# Patient Record
Sex: Female | Born: 1955
Health system: Southern US, Community
[De-identification: ages and names within clinical notes are randomized; demographics above are authoritative.]

## PROBLEM LIST (undated history)

## (undated) DIAGNOSIS — R112 Nausea with vomiting, unspecified: Secondary | ICD-10-CM

## (undated) DIAGNOSIS — R519 Headache, unspecified: Secondary | ICD-10-CM

## (undated) DIAGNOSIS — F419 Anxiety disorder, unspecified: Secondary | ICD-10-CM

## (undated) DIAGNOSIS — M1990 Unspecified osteoarthritis, unspecified site: Secondary | ICD-10-CM

## (undated) DIAGNOSIS — N189 Chronic kidney disease, unspecified: Secondary | ICD-10-CM

## (undated) DIAGNOSIS — C801 Malignant (primary) neoplasm, unspecified: Secondary | ICD-10-CM

## (undated) DIAGNOSIS — J189 Pneumonia, unspecified organism: Secondary | ICD-10-CM

## (undated) DIAGNOSIS — E0590 Thyrotoxicosis, unspecified without thyrotoxic crisis or storm: Secondary | ICD-10-CM

## (undated) DIAGNOSIS — Z87442 Personal history of urinary calculi: Secondary | ICD-10-CM

## (undated) DIAGNOSIS — G8929 Other chronic pain: Secondary | ICD-10-CM

## (undated) DIAGNOSIS — Z9889 Other specified postprocedural states: Secondary | ICD-10-CM

## (undated) DIAGNOSIS — I10 Essential (primary) hypertension: Secondary | ICD-10-CM

## (undated) HISTORY — PX: MOHS SURGERY: SHX181

## (undated) HISTORY — PX: PARTIAL KNEE ARTHROPLASTY: SHX2174

## (undated) HISTORY — PX: WISDOM TOOTH EXTRACTION: SHX21

---

## 1997-07-31 ENCOUNTER — Other Ambulatory Visit: Admission: RE | Admit: 1997-07-31 | Discharge: 1997-07-31 | Payer: Self-pay | Admitting: Obstetrics and Gynecology

## 1998-08-23 ENCOUNTER — Other Ambulatory Visit: Admission: RE | Admit: 1998-08-23 | Discharge: 1998-08-23 | Payer: Self-pay | Admitting: Obstetrics and Gynecology

## 1999-07-30 ENCOUNTER — Other Ambulatory Visit: Admission: RE | Admit: 1999-07-30 | Discharge: 1999-07-30 | Payer: Self-pay | Admitting: Obstetrics and Gynecology

## 1999-07-30 ENCOUNTER — Encounter (INDEPENDENT_AMBULATORY_CARE_PROVIDER_SITE_OTHER): Payer: Self-pay | Admitting: Specialist

## 1999-10-24 ENCOUNTER — Other Ambulatory Visit: Admission: RE | Admit: 1999-10-24 | Discharge: 1999-10-24 | Payer: Self-pay | Admitting: Obstetrics and Gynecology

## 2000-09-04 ENCOUNTER — Encounter: Payer: Self-pay | Admitting: Sports Medicine

## 2000-09-04 ENCOUNTER — Encounter: Admission: RE | Admit: 2000-09-04 | Discharge: 2000-09-04 | Payer: Self-pay | Admitting: Sports Medicine

## 2000-09-25 ENCOUNTER — Encounter: Payer: Self-pay | Admitting: Sports Medicine

## 2000-09-25 ENCOUNTER — Encounter: Admission: RE | Admit: 2000-09-25 | Discharge: 2000-09-25 | Payer: Self-pay | Admitting: Sports Medicine

## 2000-10-09 ENCOUNTER — Encounter: Admission: RE | Admit: 2000-10-09 | Discharge: 2000-10-09 | Payer: Self-pay | Admitting: Sports Medicine

## 2000-10-09 ENCOUNTER — Encounter: Payer: Self-pay | Admitting: Sports Medicine

## 2000-11-04 ENCOUNTER — Other Ambulatory Visit: Admission: RE | Admit: 2000-11-04 | Discharge: 2000-11-04 | Payer: Self-pay | Admitting: Obstetrics and Gynecology

## 2001-11-04 ENCOUNTER — Other Ambulatory Visit: Admission: RE | Admit: 2001-11-04 | Discharge: 2001-11-04 | Payer: Self-pay | Admitting: Obstetrics and Gynecology

## 2002-11-07 ENCOUNTER — Other Ambulatory Visit: Admission: RE | Admit: 2002-11-07 | Discharge: 2002-11-07 | Payer: Self-pay | Admitting: Obstetrics and Gynecology

## 2003-04-19 ENCOUNTER — Encounter: Admission: RE | Admit: 2003-04-19 | Discharge: 2003-04-19 | Payer: Self-pay | Admitting: Otolaryngology

## 2003-11-09 ENCOUNTER — Other Ambulatory Visit: Admission: RE | Admit: 2003-11-09 | Discharge: 2003-11-09 | Payer: Self-pay | Admitting: Obstetrics and Gynecology

## 2004-12-09 ENCOUNTER — Other Ambulatory Visit: Admission: RE | Admit: 2004-12-09 | Discharge: 2004-12-09 | Payer: Self-pay | Admitting: Obstetrics and Gynecology

## 2005-06-20 ENCOUNTER — Encounter (INDEPENDENT_AMBULATORY_CARE_PROVIDER_SITE_OTHER): Payer: Self-pay | Admitting: *Deleted

## 2005-06-20 ENCOUNTER — Ambulatory Visit (HOSPITAL_COMMUNITY): Admission: RE | Admit: 2005-06-20 | Discharge: 2005-06-20 | Payer: Self-pay | Admitting: Obstetrics and Gynecology

## 2005-11-26 IMAGING — CT CT PARANASAL SINUSES LIMITED
1 series · 16 of 24 positions shown, 20 images · non-contrast
Comparison: none

CLINICAL DATA: Headaches, facial pressure and pain. 
 CT LIMITED SINUS W/O CONTRAST
 Limited scans through the paranasal sinuses were performed in the prone coronal position.  There is no present evidence of sinusitis.  No mucosal thickening is seen.  The nasal turbinates are normal in size and the nasal airway is patent.  Minimal deviation of the nasal septum to the left is noted.  
 IMPRESSION
 No evidence of sinusitis.

[Series 2: — · axial · 0.33mm/px · z∈[+21,+106]mm · 16 of 24 slices shown, 20 images]
[im 2/24  brain]
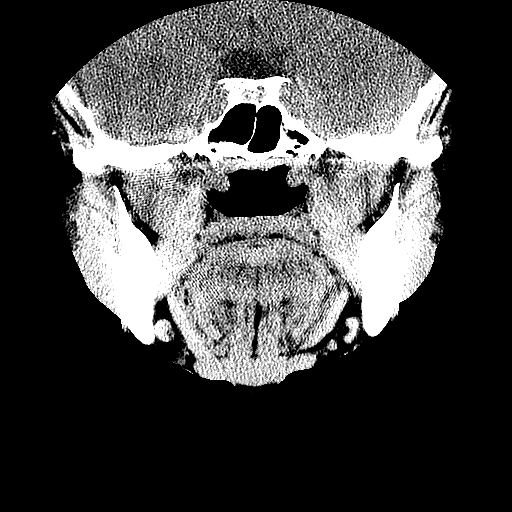
[im 2/24  bone]
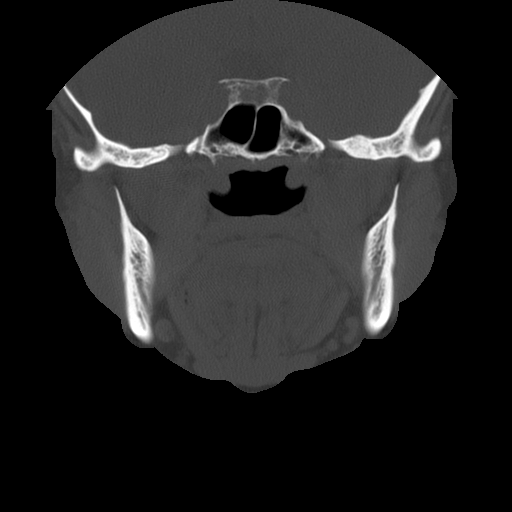
[im 4/24  bone]
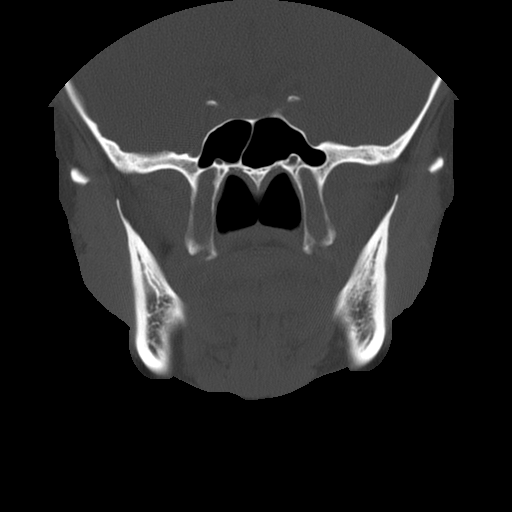
[im 5/24  bone]
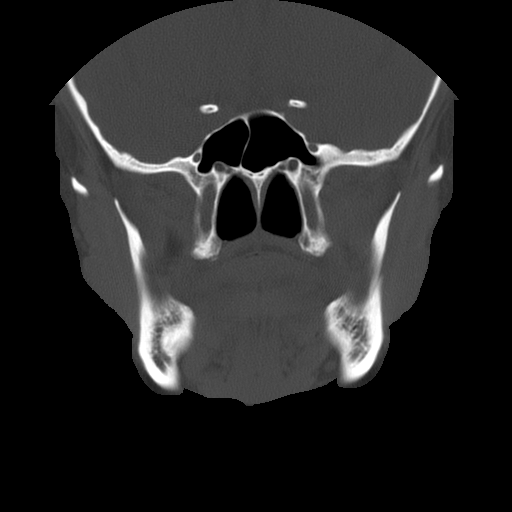
[im 6/24  bone]
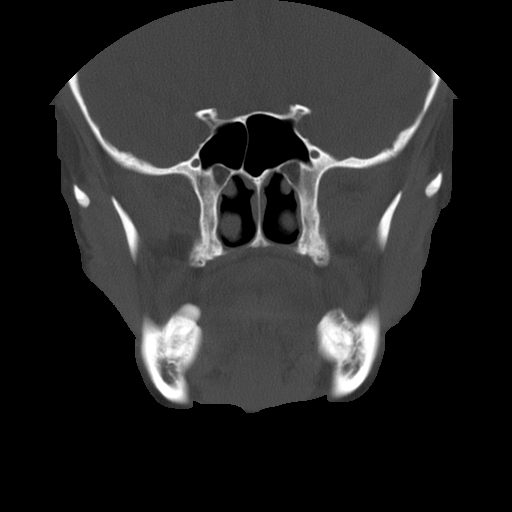
[im 8/24  brain]
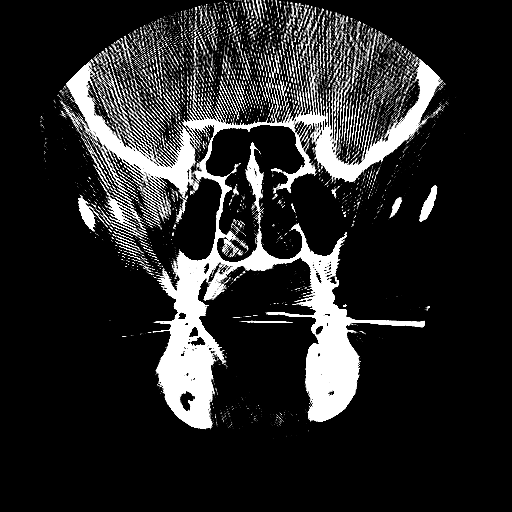
[im 8/24  bone]
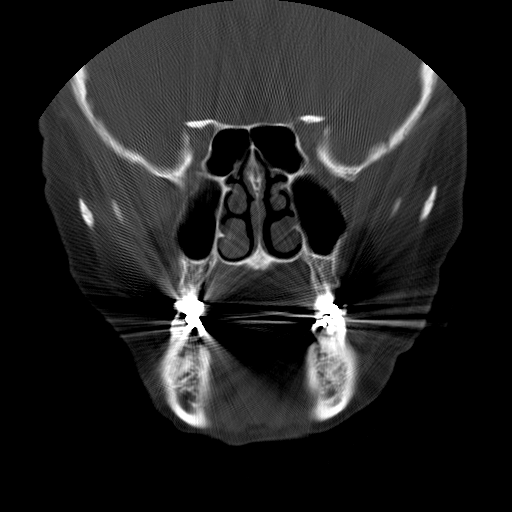
[im 9/24  bone]
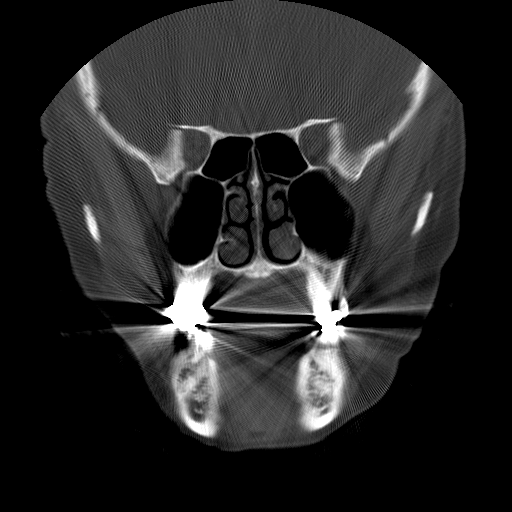
[im 10/24  bone]
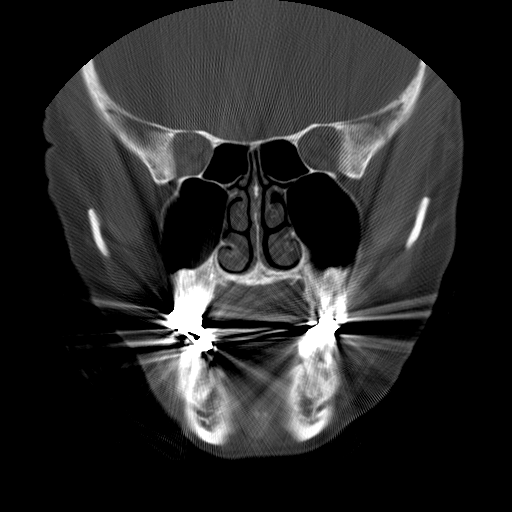
[im 12/24  bone]
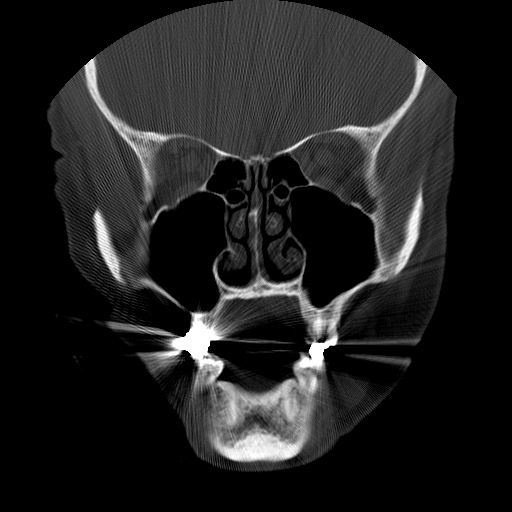
[im 13/24  brain]
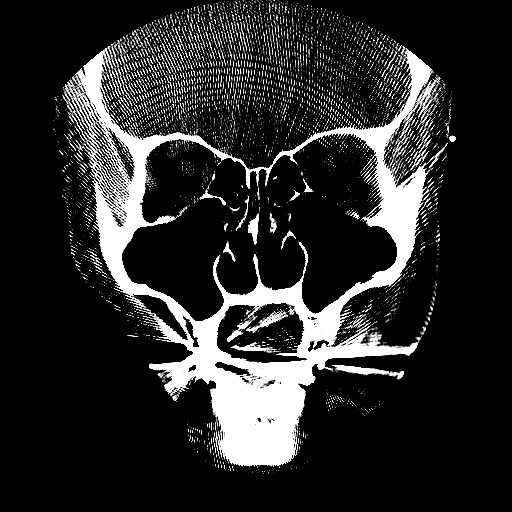
[im 13/24  bone]
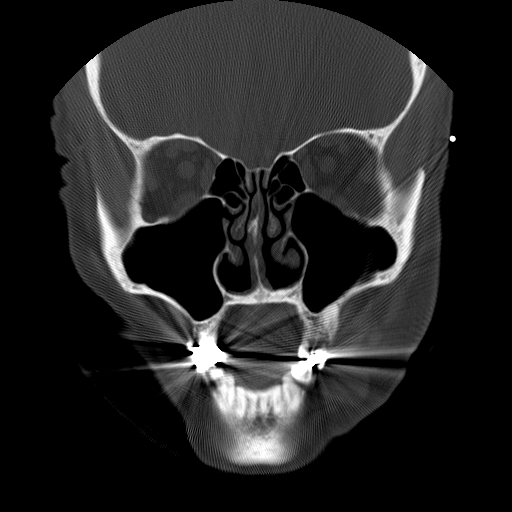
[im 15/24  bone]
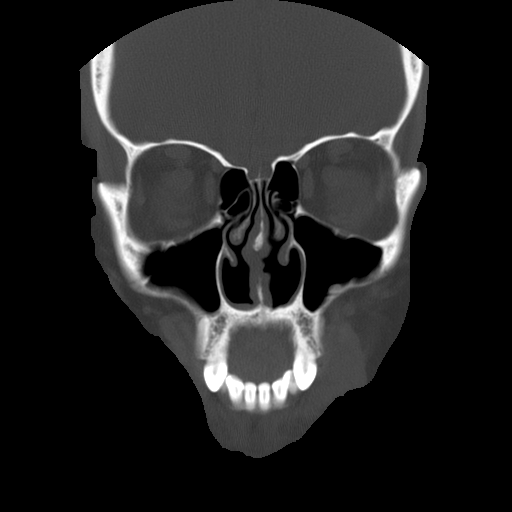
[im 16/24  bone]
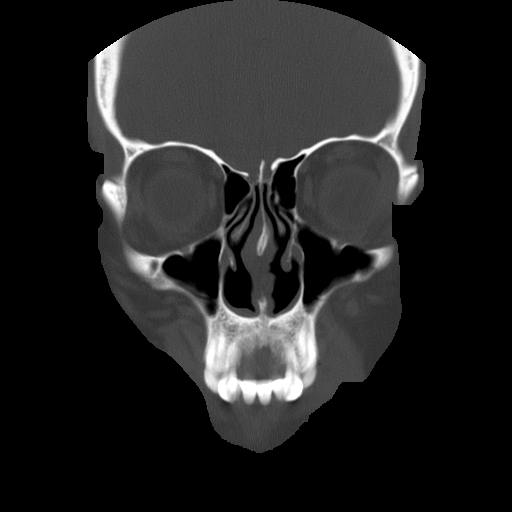
[im 17/24  bone]
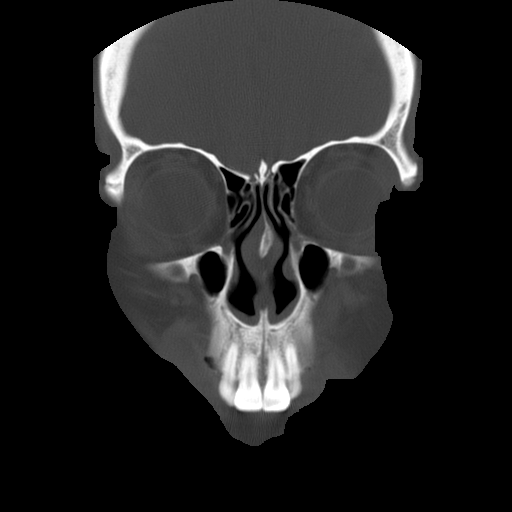
[im 19/24  brain]
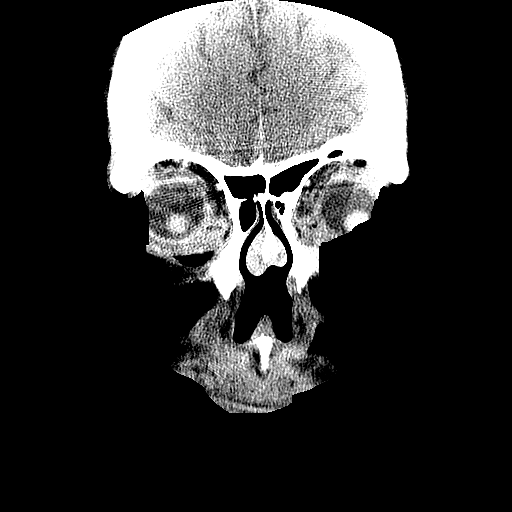
[im 19/24  bone]
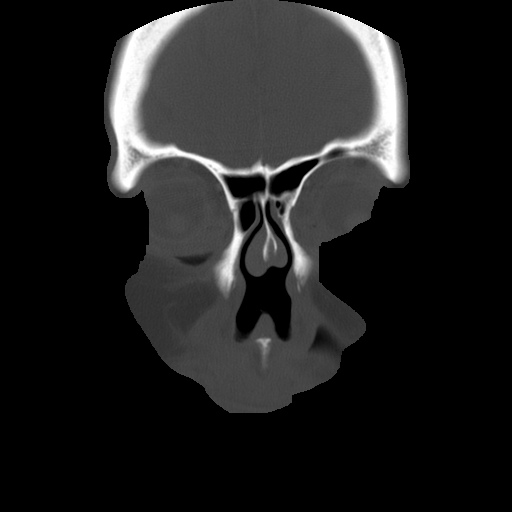
[im 20/24  bone]
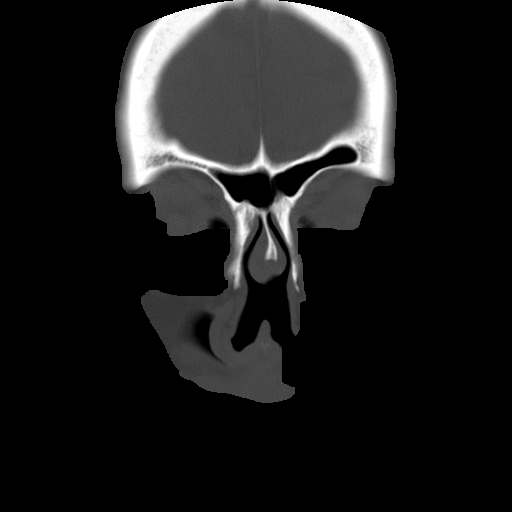
[im 21/24  bone]
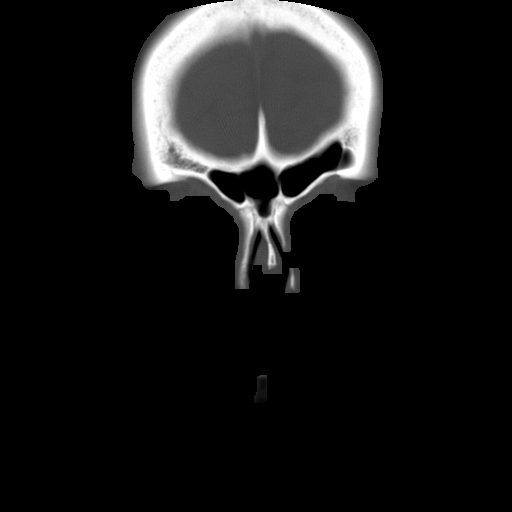
[im 23/24  bone]
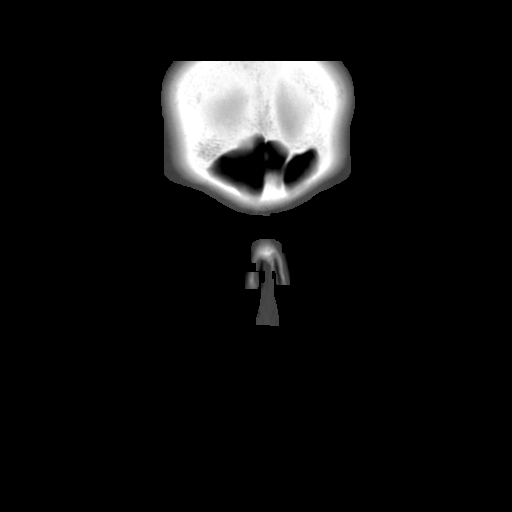

[16 of 24 positions shown; findings below may reference images not displayed]

## 2008-10-02 ENCOUNTER — Encounter: Admission: RE | Admit: 2008-10-02 | Discharge: 2008-10-02 | Payer: Self-pay | Admitting: Family Medicine

## 2009-11-05 ENCOUNTER — Encounter: Admission: RE | Admit: 2009-11-05 | Discharge: 2009-11-05 | Payer: Self-pay | Admitting: Obstetrics and Gynecology

## 2010-05-10 IMAGING — CR DG CHEST 2V
1 series · 2 of 2 positions shown · non-contrast
Comparison: NONE

CLINICAL DATA: Cough for 3 weeks. Congestion. 

CHEST TWO VIEW (PA AND LATERAL)

[Series 1: view not recorded · 0.17mm/px · 2 of 2 slices shown]
[im 1/2]
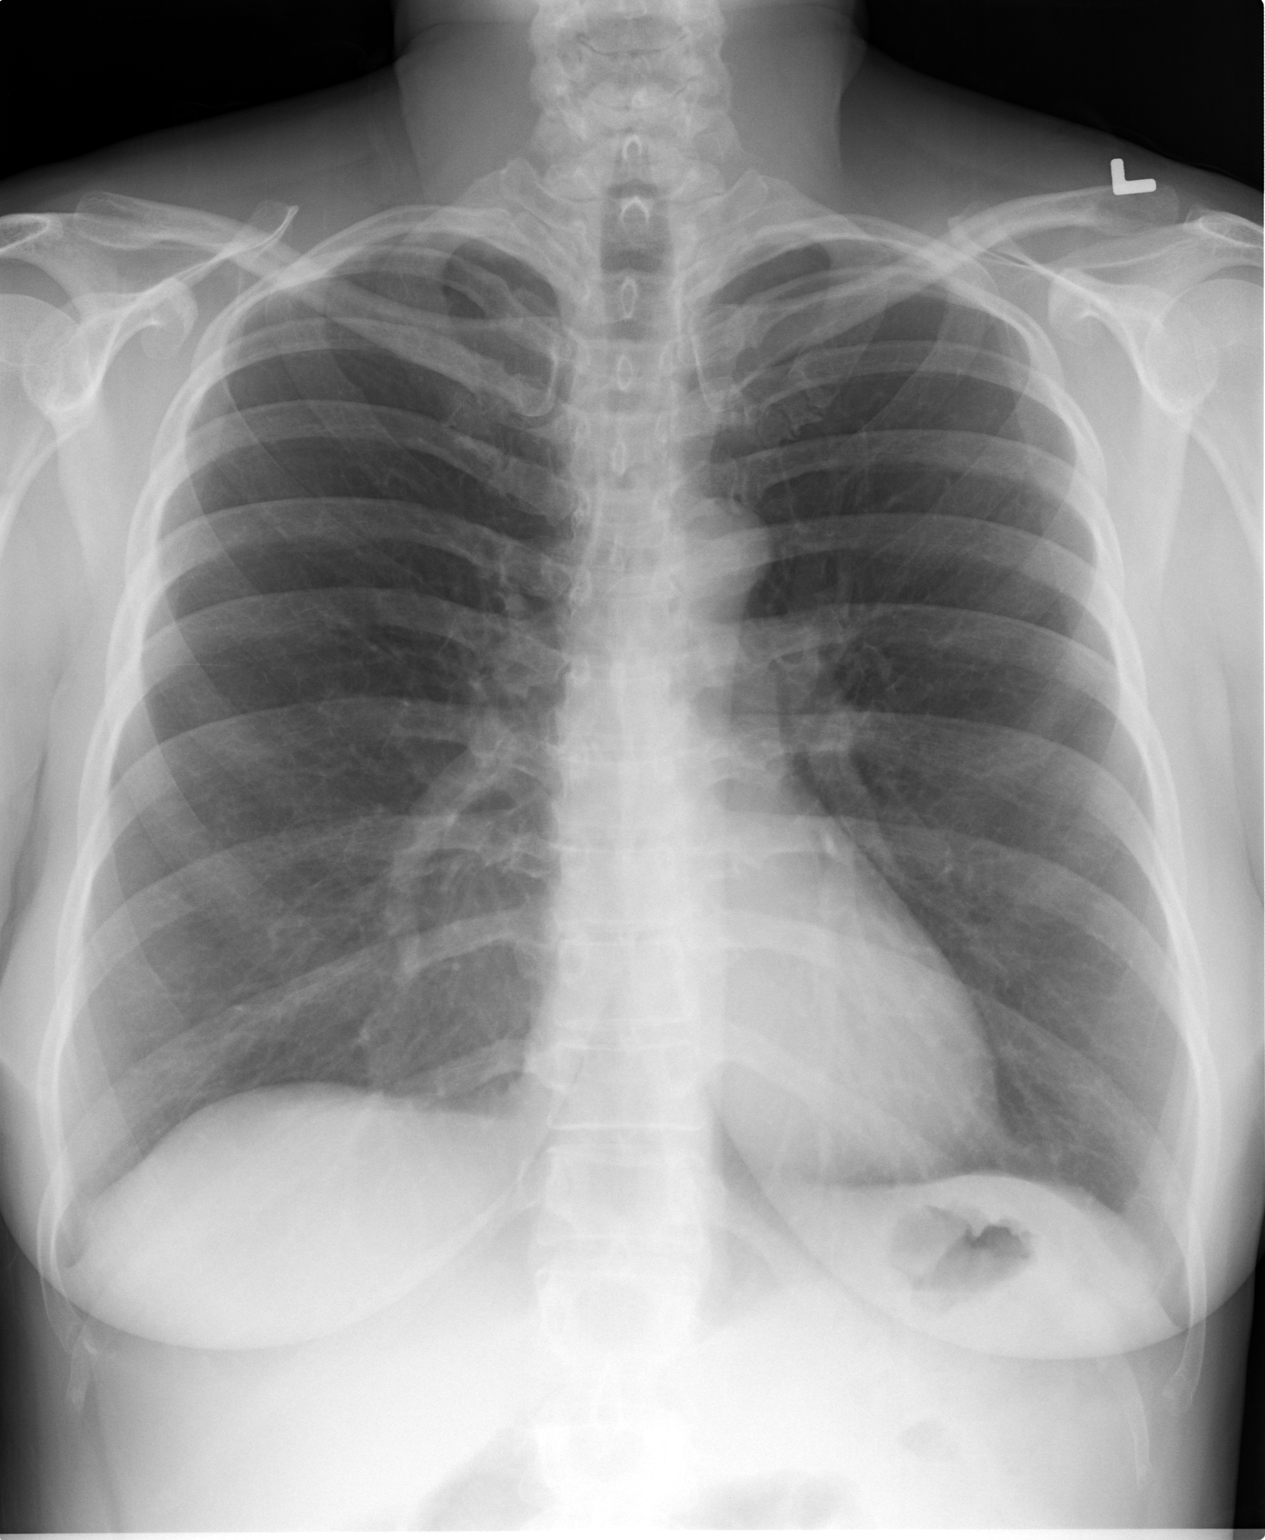
[im 2/2]
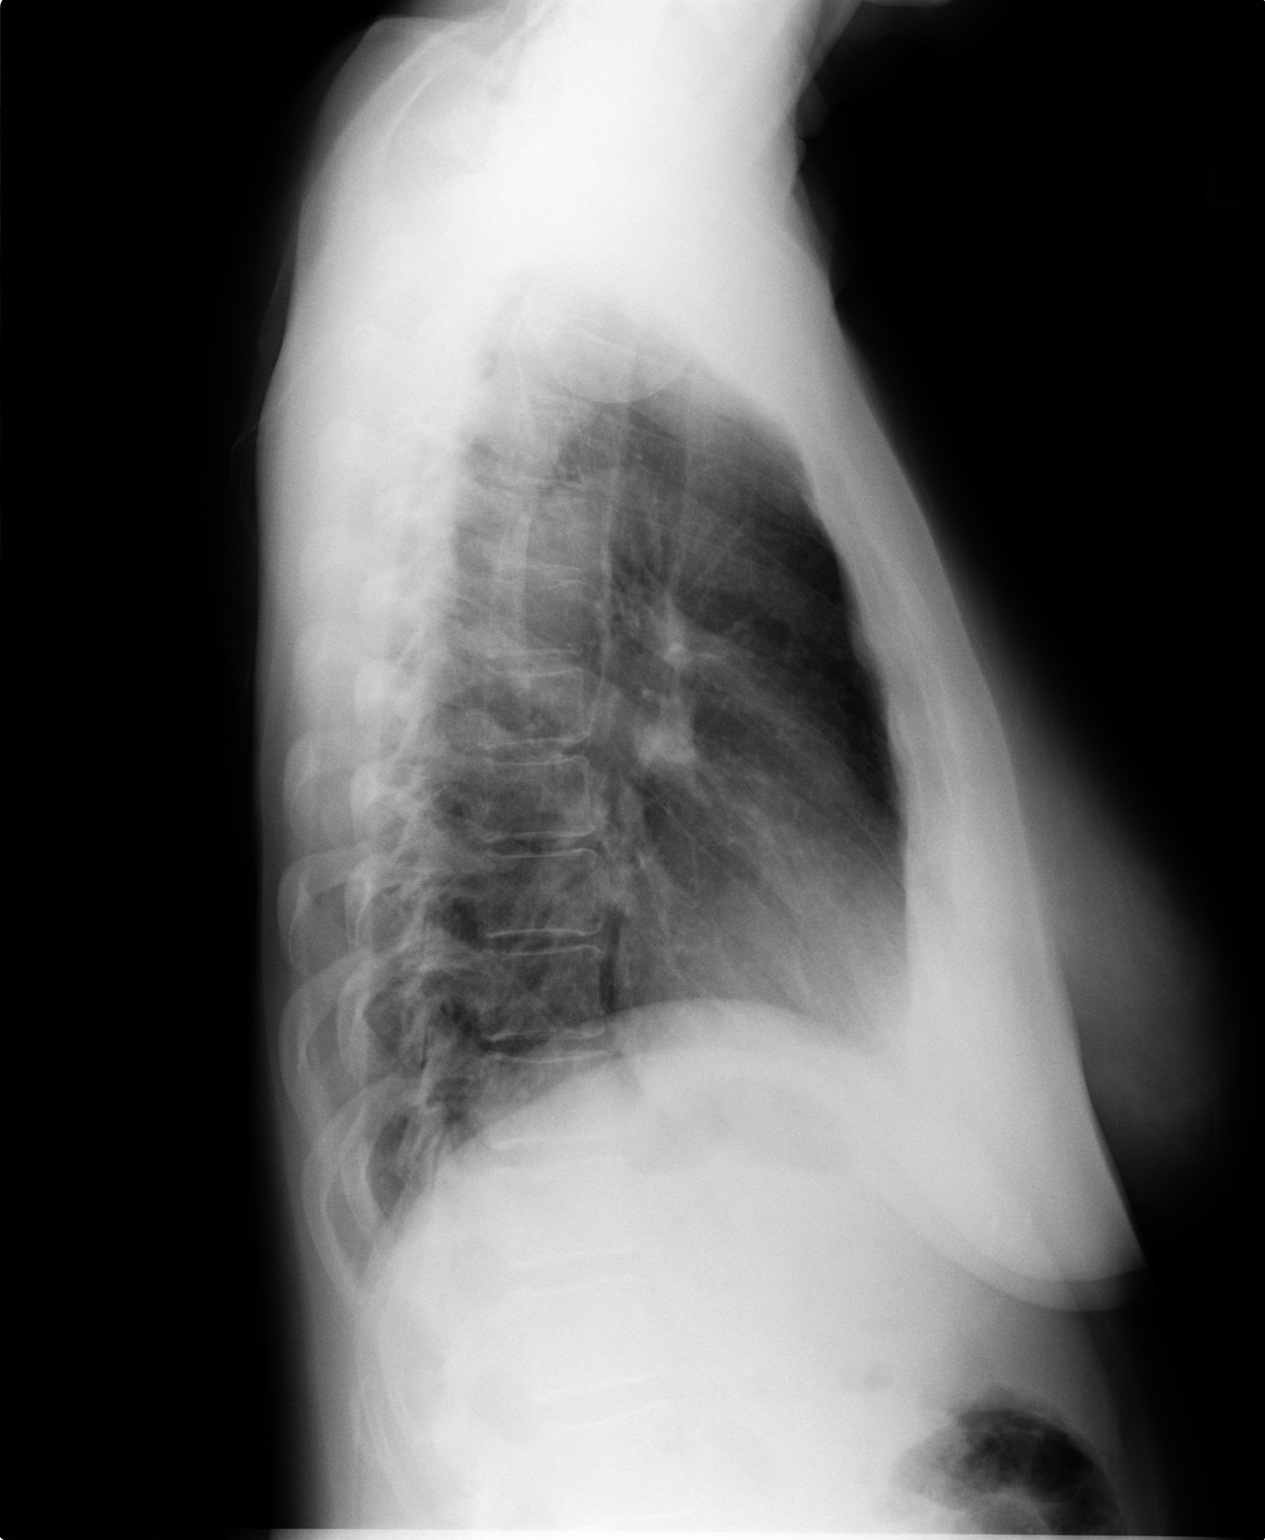

[2 of 2 positions shown; findings below may reference images not displayed]

FINDINGS: The lungs are clear and well expanded.   Heart and 
pulmonary vessels are normal.  No significant abnormalities are 
noted in the regional skeleton.  No evidence of thoracic aortic 
aneurysm or dissection. 

electronically reviewed on 10/04/2007 Dict Date: 10/01/2007  Tran 
Date:  10/04/2007 DAS  [REDACTED]

## 2010-09-27 NOTE — Op Note (Signed)
Savannah Martinez, Savannah Martinez                 ACCOUNT NO.:  [id number]   MEDICAL RECORD NO.:  [id number]          PATIENT TYPE:  AMB   LOCATION:  [facility]                           FACILITY:  [facility]   PHYSICIAN:  Juluis Mire, M.D.   DATE OF BIRTH:  04/01/1956   DATE OF PROCEDURE:  06/20/2005  DATE OF DISCHARGE:                                 OPERATIVE REPORT   PREOPERATIVE DIAGNOSIS:  Postmenopausal bleeding.   POSTOPERATIVE DIAGNOSIS:  Postmenopausal bleeding with endometrial polyps.   OPERATIVE PROCEDURES:  1.  Paracervical block.  2.  Cervical dilatation.  3.  Hysteroscopy with biopsy and removal of polyps as well as endometrial      biopsies, followed by endometrial curetting.   SURGEON:  Juluis Mire, M.D.   ANESTHESIA:  Sedation with paracervical block.   ESTIMATED BLOOD LOSS:  Minimal.   PACKS AND DRAINS:  None.   INTRAOPERATIVE BLOOD REPLACED:  None.   COMPLICATIONS:  None.   INDICATIONS:  Dictated in the history and physical.   PROCEDURE:  The patient was taken to the OR and placed in the supine  position.  After sedation, was placed in the dorsal lithotomy position using  the Allen stirrups.  The patient was then draped as a sterile field.  A  speculum was placed in the vaginal vault.  The cervix was cleansed out with  Betadine.  A paracervical block was instituted using 1% Nesacaine.  The  cervix had been secured with a single-tooth tenaculum.  The uterus sounded  to 8 cm.  The cervix was serially dilated to a size 35 Pratt dilator.  It  was somewhat hard to dilate and stenotic.  The operative hysteroscope was  then introduced.  The intrauterine cavity was distended using sorbitol.  There was no sign of perforation.  She had two small endometrial polyps  noted on the anterior and right lateral wall.  These were biopsied and sent  for pathologic review.  They were actually removed totally.  Next  endometrial biopsies were obtained along  with curettings.  There was no sign  of perforation.  The deficit was 100 mL.  The patient was taken out of the dorsal lithotomy position and once alert  transferred to the recovery room in good condition.  Sponge, instrument and  needle count reported as correct by the circulating nurse.      Juluis Mire, M.D.  Electronically Signed     JSM/MEDQ  D:  06/20/2005  T:  06/20/2005  Job:  [id number]

## 2010-09-27 NOTE — H&P (Signed)
Savannah Martinez, Savannah Martinez                 ACCOUNT NO.:  [id number]   MEDICAL RECORD NO.:  [id number]          PATIENT TYPE:  AMB   LOCATION:  [facility]                           FACILITY:  [facility]   PHYSICIAN:  Juluis Mire, M.D.   DATE OF BIRTH:  1955-11-07   DATE OF ADMISSION:  06/20/2005  DATE OF DISCHARGE:                                HISTORY & PHYSICAL   The patient is a 55 year old nulligravida single female.  Presents for  hysteroscopy and D&C.   In relation to the present admission patient has been on hormone replacement  therapy in the form of Climara Pro.  She has had the onset of abnormal  vaginal bleeding.  Ultrasound revealed a marked endometrial thickness.  Due  to cervical stenosis we were unable to do an endometrial sampling for  evaluation in the office, therefore she presents for [facility] and hysteroscopy.   ALLERGIES:  She has no known drug allergies.   MEDICATIONS:  Climara Pro patch, routine vitamins.   PAST MEDICAL HISTORY:  Usual childhood diseases, no significant sequelae.  She has had prior knee surgery.   FAMILY HISTORY:  Noncontributory.   SOCIAL HISTORY:  Noncontributory.   REVIEW OF SYSTEMS:  Noncontributory.   PHYSICAL EXAMINATION:  VITAL SIGNS:  Patient is afebrile with stable vital  signs.  HEENT:  Patient normocephalic.  Pupils are equal, round, and reactive to  light and accommodation.  Extraocular movements are intact.  Sclerae and  conjunctivae clear.  Oropharynx clear.  NECK:  Without thyromegaly.  BREASTS:  No discrete masses.  LUNGS:  Clear.  CARDIOVASCULAR:  Regular rhythm and rate without murmurs or gallops.  ABDOMEN:  Benign.  No mass, organomegaly, or tenderness.  PELVIC:  Normal external genitalia.  Vaginal mucosa clear.  Cervix  unremarkable.  Uterus normal size, shape, and contour.  Adnexa free of  masses or tenderness.  EXTREMITIES:  Trace edema.  NEUROLOGIC:  Grossly within normal limits.   IMPRESSION:  Abnormal post menopausal bleeding,  rule out endometrial  pathology.   PLAN:  The patient will undergo the hysteroscopy with D&C.  Risks of surgery  have been discussed including the risks of infection, the risk of hemorrhage  that could require transfusion or possible hysterectomy, risk of injury to  adjacent organs that could require exploratory surgery, risk of deep venous  thrombosis and pulmonary embolus.  Patient expressed understanding of  indications and risks.      Juluis Mire, M.D.  Electronically Signed     JSM/MEDQ  D:  06/20/2005  T:  06/20/2005  Job:  [id number]

## 2011-05-12 IMAGING — CR DG CHEST 2V
2 series · 2 of 2 positions shown · non-contrast
Comparison: None

CLINICAL DATA: Cough

CHEST - 2 VIEW

[view not recorded (1 of 2)]
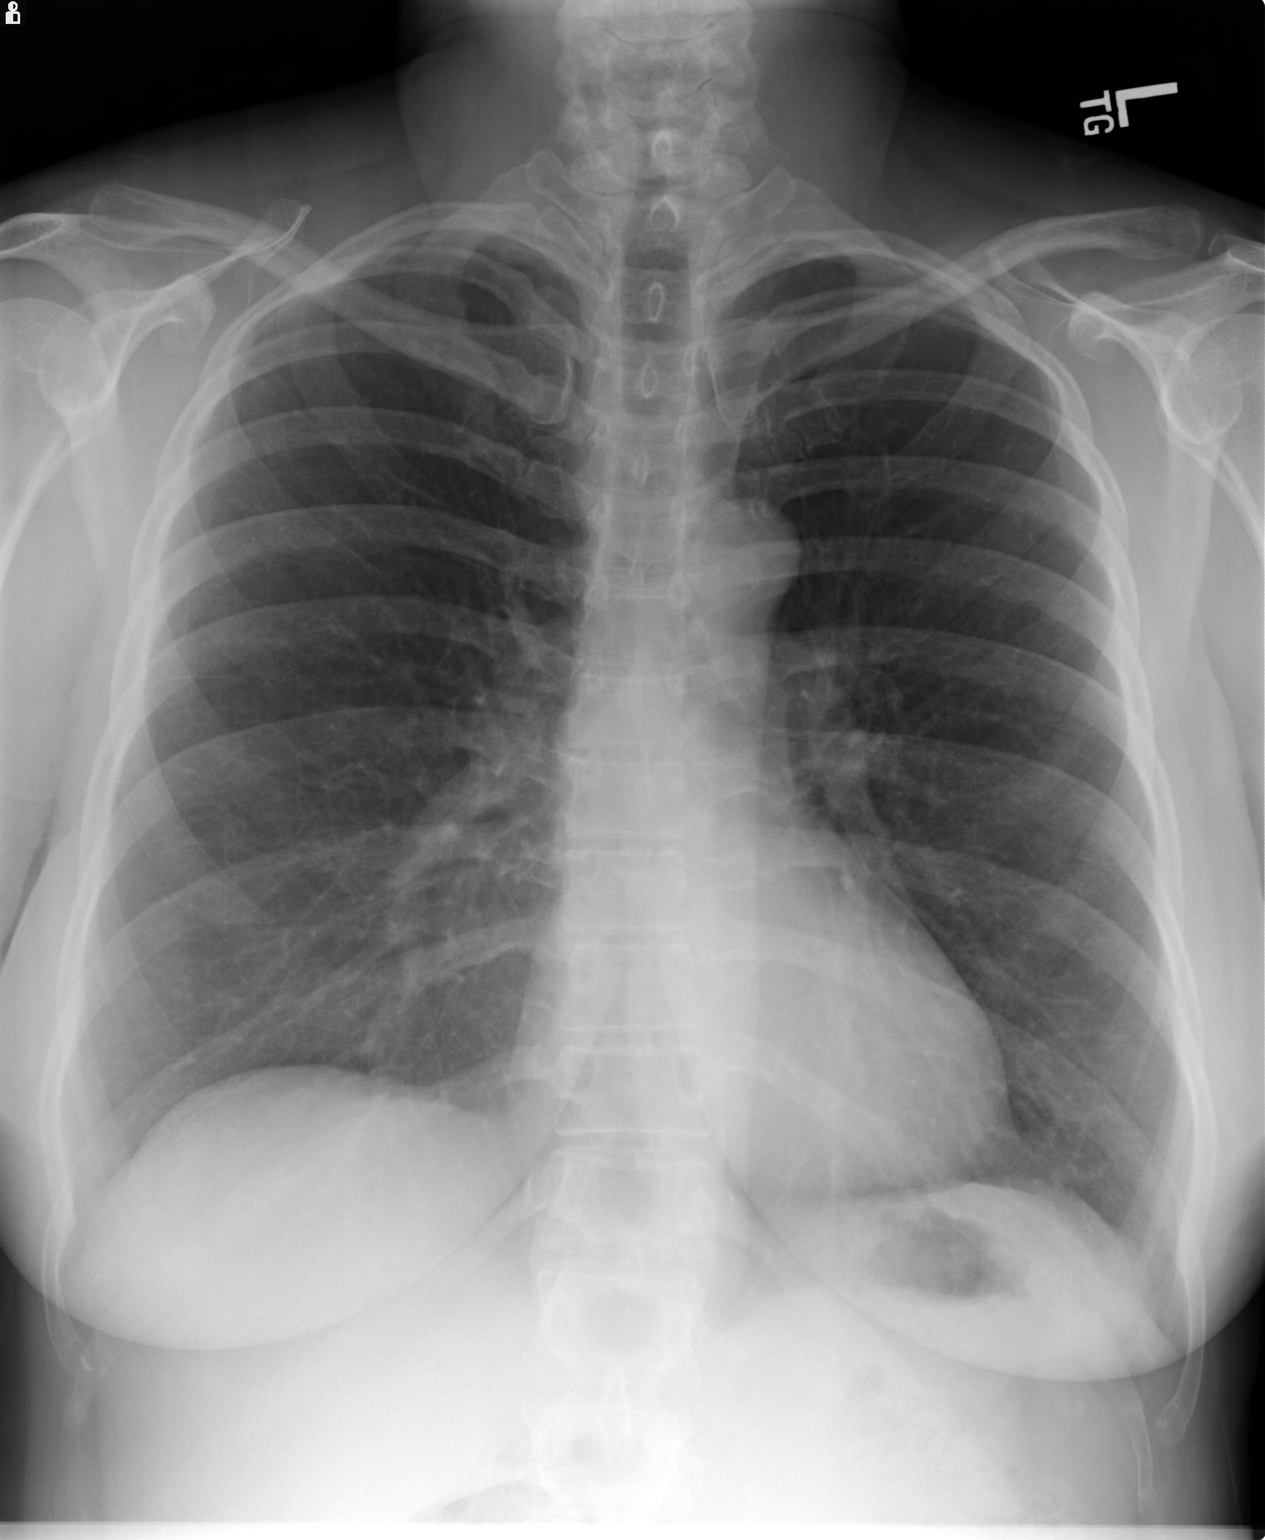

[view not recorded (2 of 2)]
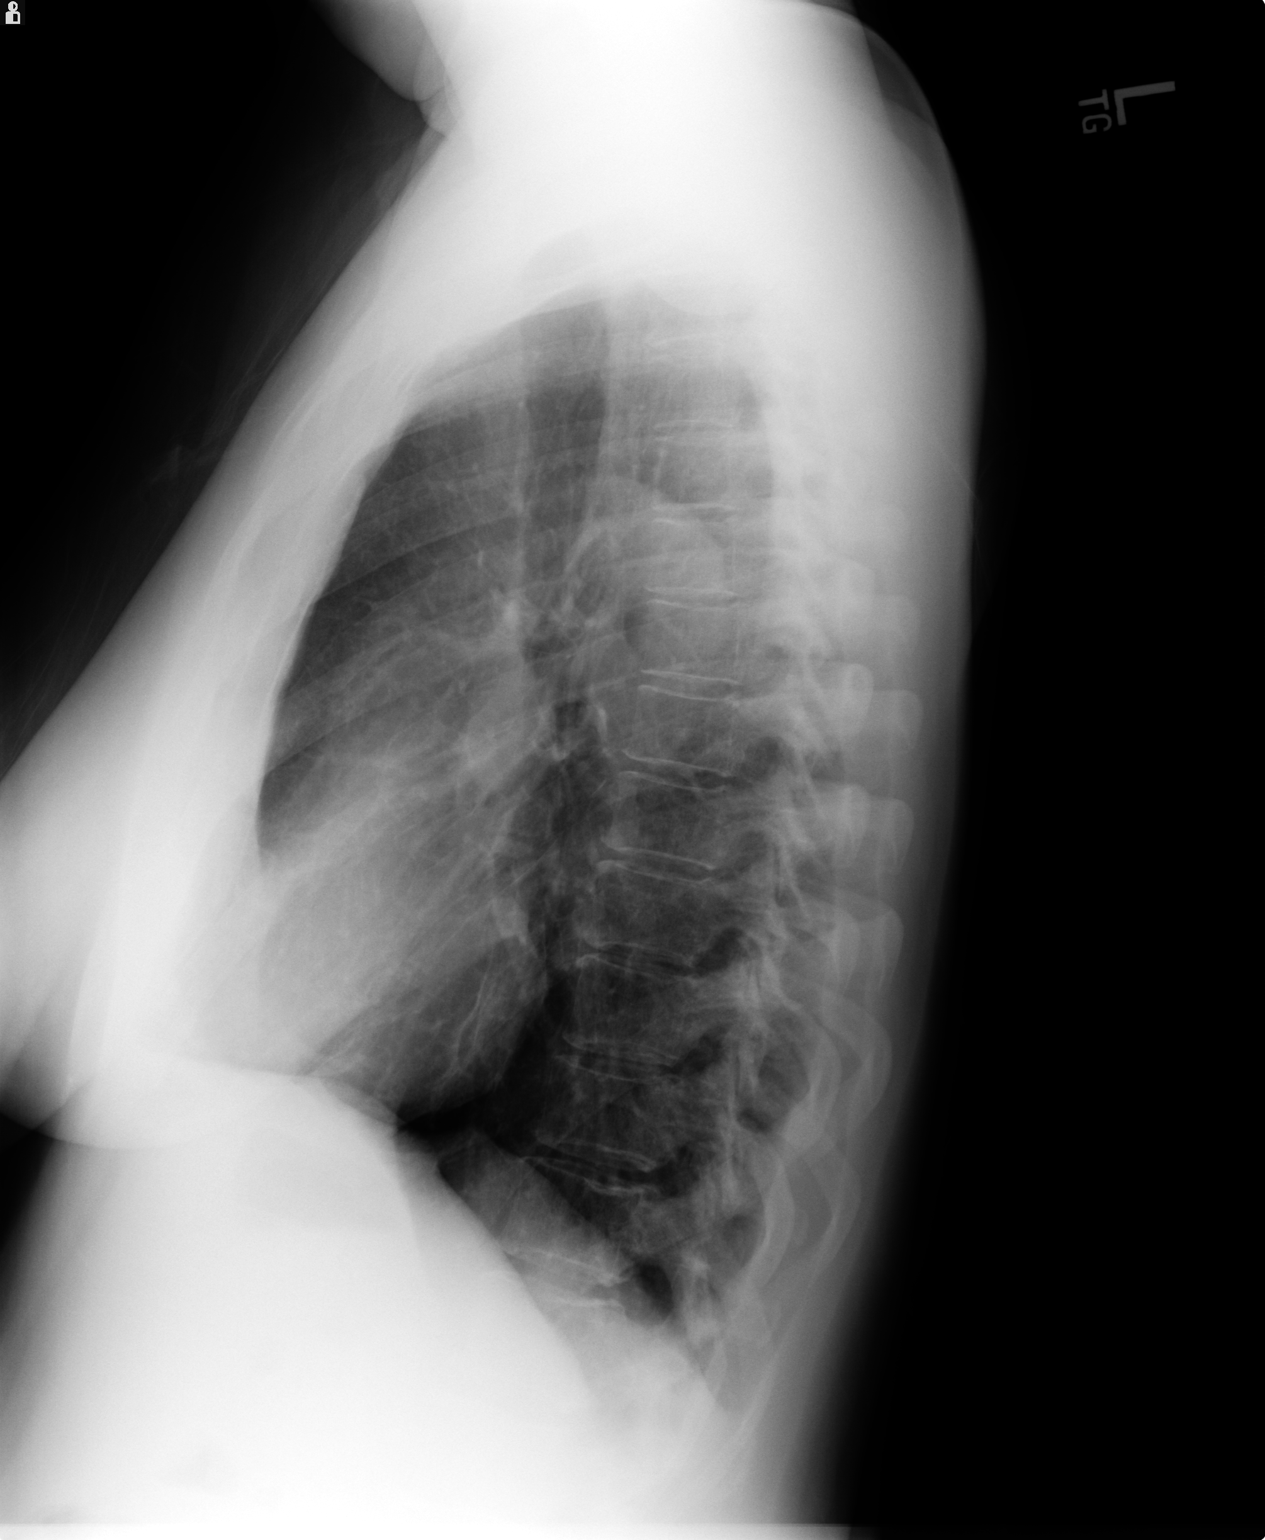

[2 of 2 positions shown; findings below may reference images not displayed]

FINDINGS: Cardiomediastinal silhouette is within normal limits. The
lungs are clear. No pleural effusion.  No pneumothorax.  No acute
osseous abnormality.
IMPRESSION: No acute cardiopulmonary process.

## 2012-06-14 IMAGING — US US BREAST R
1 series · 4 of 4 positions shown · non-contrast
Comparison: none

CLINICAL DATA: The patient returns for evaluation of a possible
mass in the right breast noted on recent screening study from
[REDACTED] dated 10/25/2009.

DIGITAL DIAGNOSTIC RIGHT MAMMOGRAM WITH CAD AND RIGHT BREAST
ULTRASOUND:

[Series 1: us breast right · 4 of 4 slices shown]
[im 1/4]
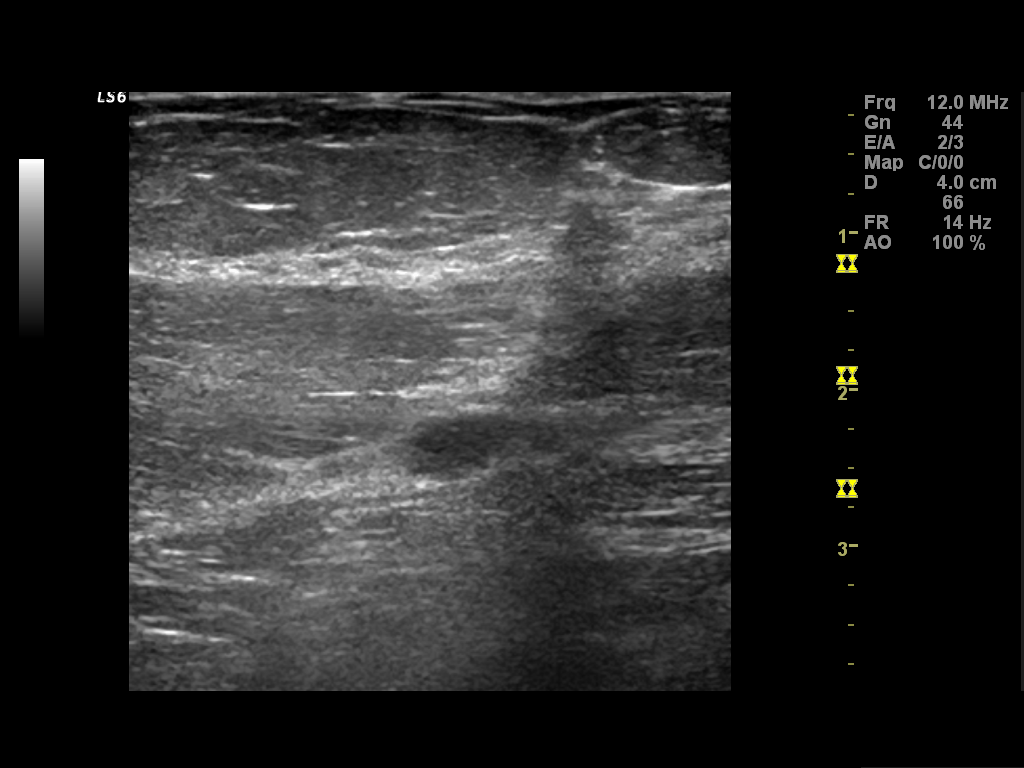
[im 2/4]
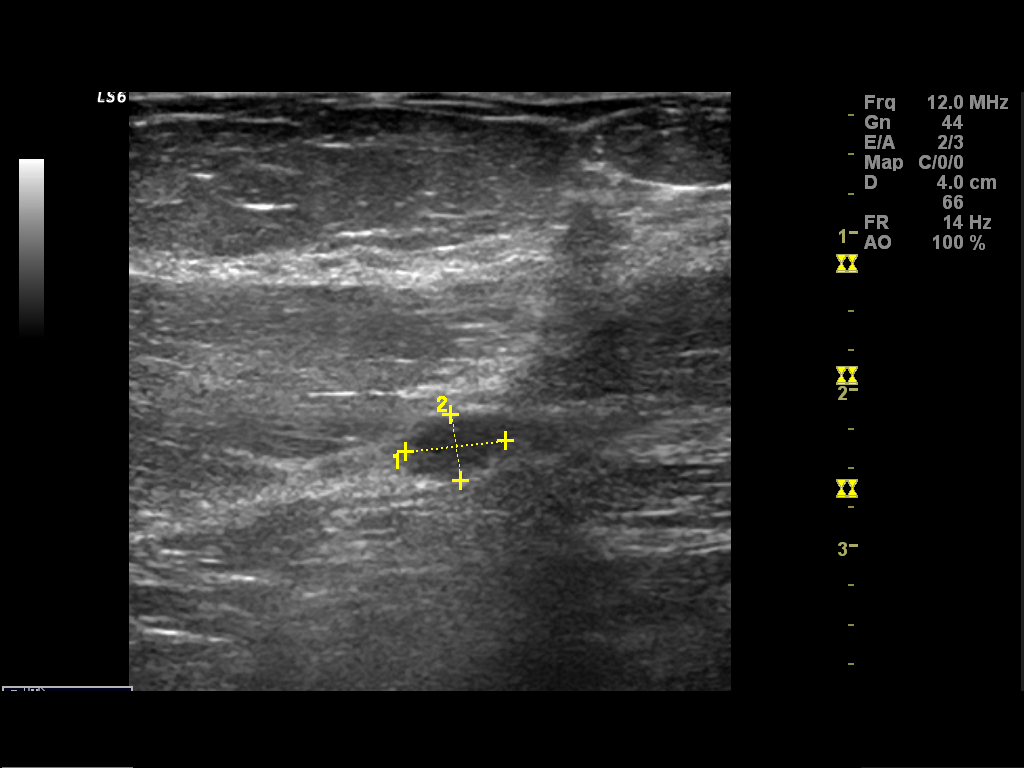
[im 3/4]
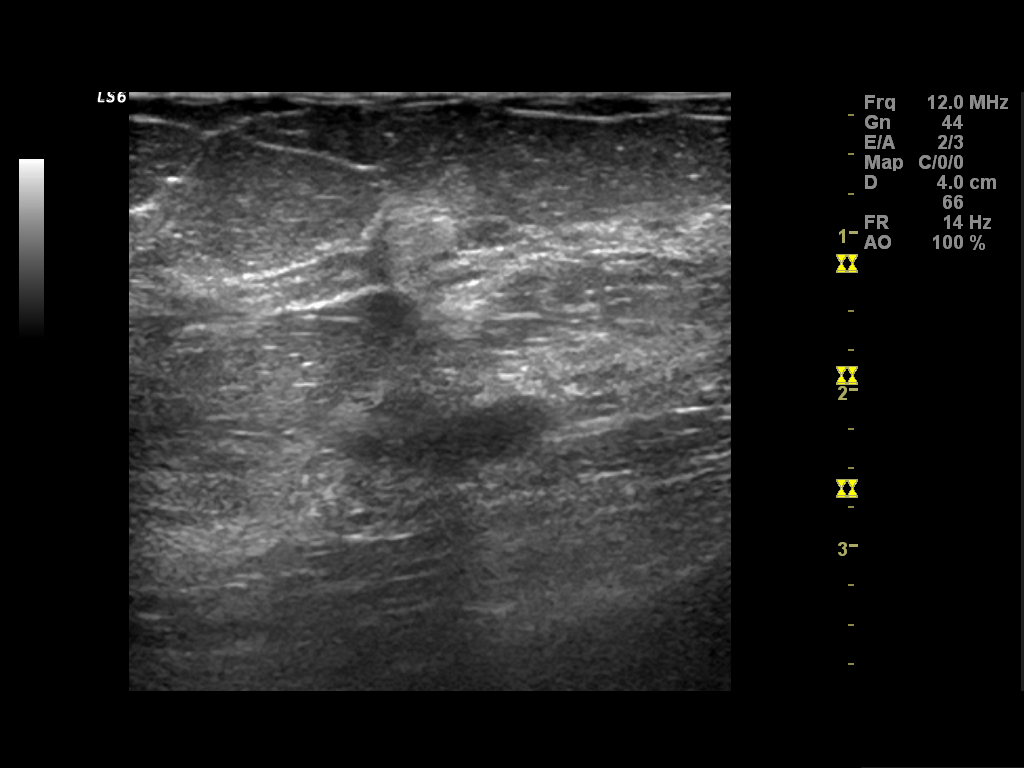
[im 4/4]
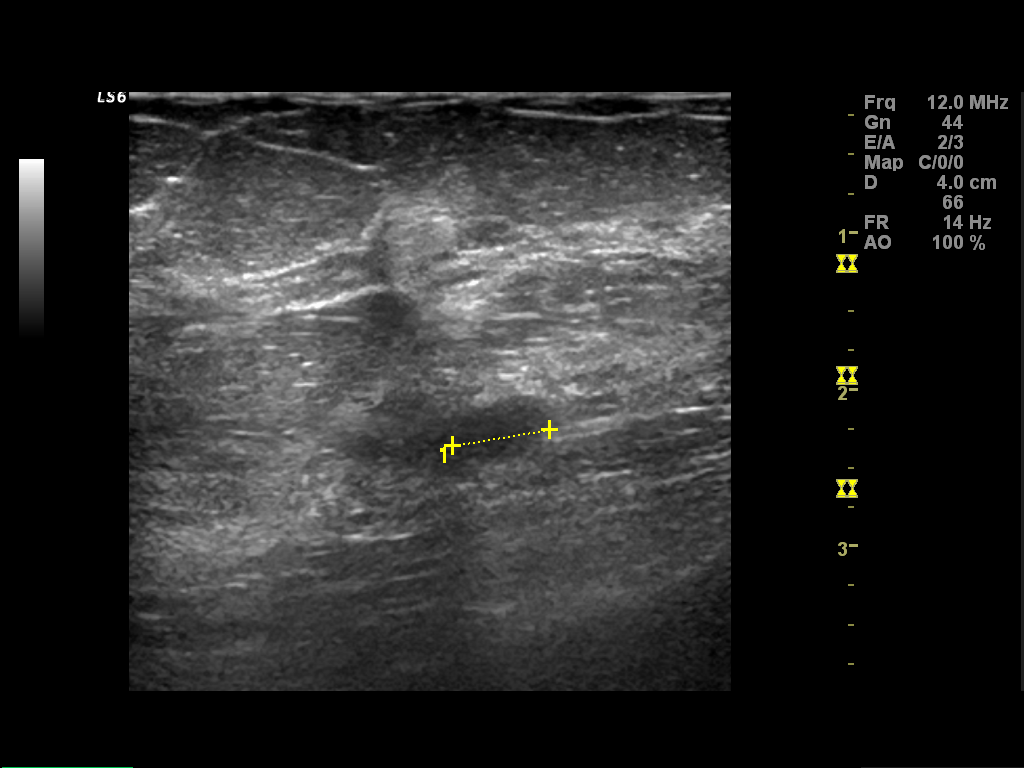

[4 of 4 positions shown; findings below may reference images not displayed]

FINDINGS: Additional views confirm the presence of a partially
circumscribed partially obscured oval nodule in the right upper
inner quadrant posteriorly.
Mammographic images were processed with CAD.

On physical exam, no mass is palpated in the right upper inner
quadrant.

Ultrasound is performed, showing a cyst at 1 o'clock 6 cm from the
right nipple measuring 6 x 4 x 6 mm.
IMPRESSION: No mammographic or sonographic evidence of malignancy.  The
mammographic nodule corresponds to a cyst.  Yearly screening
mammography is suggested.

BI-RADS CATEGORY 2:  Benign finding(s).

## 2012-06-15 IMAGING — MG MM DIGITAL DIAGNOSTIC UNILAT R
3 series · 3 of 3 positions shown · non-contrast
Comparison: none

CLINICAL DATA: The patient returns for evaluation of a possible
mass in the right breast noted on recent screening study from
[REDACTED] dated 10/25/2009.

DIGITAL DIAGNOSTIC RIGHT MAMMOGRAM WITH CAD AND RIGHT BREAST
ULTRASOUND:

[R CC]
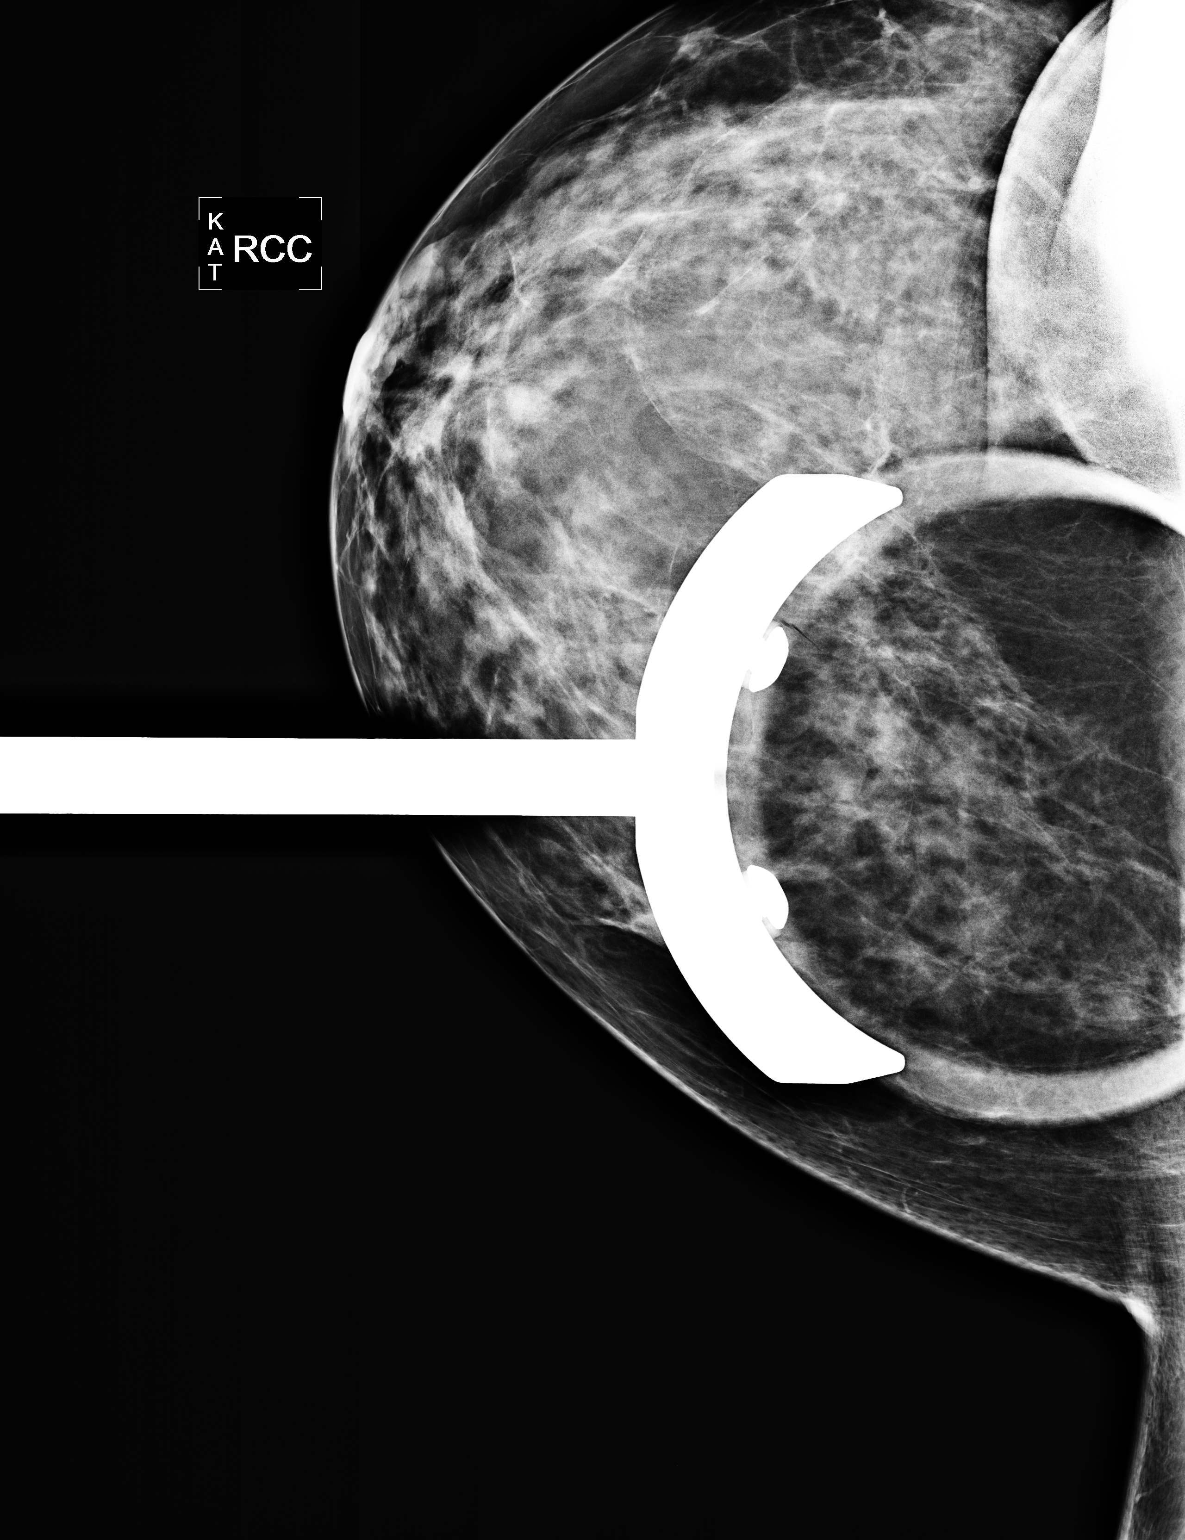

[R ML (1 of 2)]
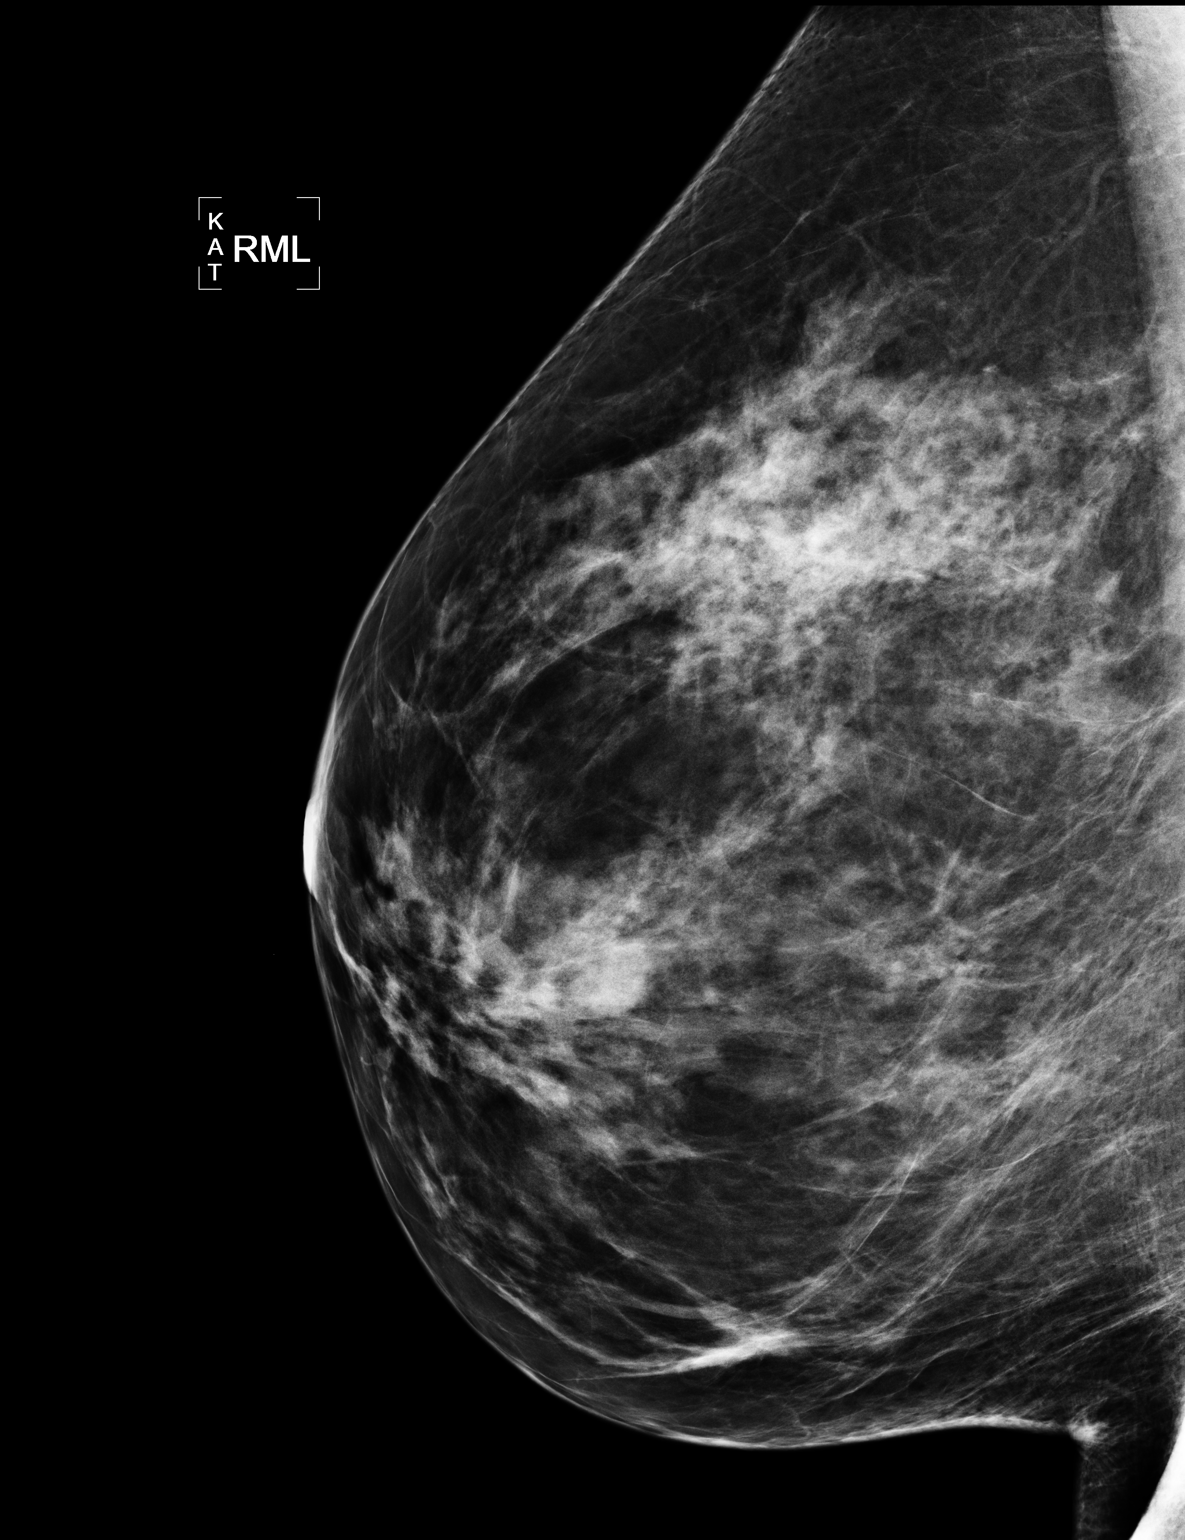

[R ML (2 of 2)]
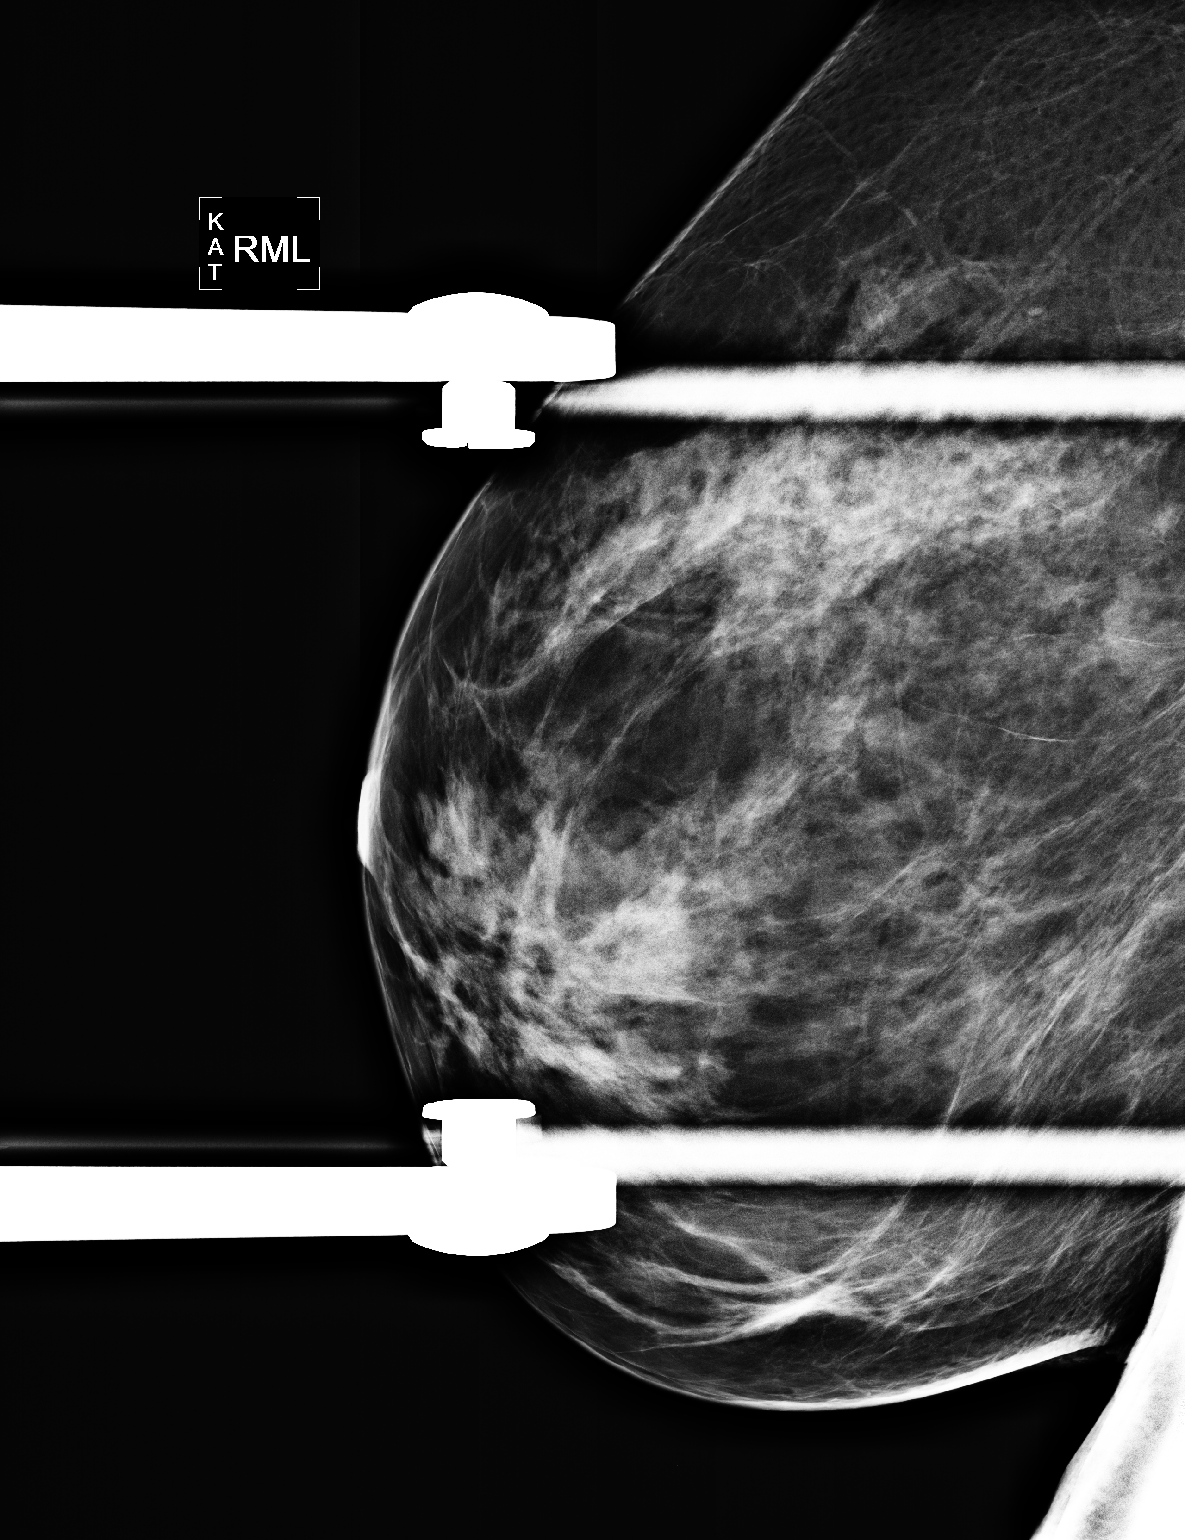

[3 of 3 positions shown; findings below may reference images not displayed]

FINDINGS: Additional views confirm the presence of a partially
circumscribed partially obscured oval nodule in the right upper
inner quadrant posteriorly.
Mammographic images were processed with CAD.

On physical exam, no mass is palpated in the right upper inner
quadrant.

Ultrasound is performed, showing a cyst at 1 o'clock 6 cm from the
right nipple measuring 6 x 4 x 6 mm.
IMPRESSION: No mammographic or sonographic evidence of malignancy.  The
mammographic nodule corresponds to a cyst.  Yearly screening
mammography is suggested.

BI-RADS CATEGORY 2:  Benign finding(s).

## 2014-04-17 ENCOUNTER — Other Ambulatory Visit: Payer: Self-pay | Admitting: Obstetrics and Gynecology

## 2014-04-18 LAB — CYTOLOGY - PAP

## 2014-04-19 ENCOUNTER — Other Ambulatory Visit: Payer: Self-pay | Admitting: Obstetrics and Gynecology

## 2014-04-19 DIAGNOSIS — R928 Other abnormal and inconclusive findings on diagnostic imaging of breast: Secondary | ICD-10-CM

## 2014-05-04 ENCOUNTER — Other Ambulatory Visit: Payer: Self-pay

## 2015-05-13 HISTORY — PX: PARTIAL KNEE ARTHROPLASTY: SHX2174

## 2015-12-06 ENCOUNTER — Other Ambulatory Visit: Payer: Self-pay | Admitting: Orthopedic Surgery

## 2015-12-06 DIAGNOSIS — M1990 Unspecified osteoarthritis, unspecified site: Secondary | ICD-10-CM

## 2015-12-06 DIAGNOSIS — M542 Cervicalgia: Secondary | ICD-10-CM

## 2015-12-16 ENCOUNTER — Ambulatory Visit
Admission: RE | Admit: 2015-12-16 | Discharge: 2015-12-16 | Disposition: A | Discharge status: Home / Self Care | Payer: Self-pay | Source: Ambulatory Visit | Attending: Orthopedic Surgery | Admitting: Orthopedic Surgery

## 2015-12-16 DIAGNOSIS — M1990 Unspecified osteoarthritis, unspecified site: Secondary | ICD-10-CM

## 2015-12-16 DIAGNOSIS — M542 Cervicalgia: Secondary | ICD-10-CM

## 2018-07-22 ENCOUNTER — Other Ambulatory Visit: Payer: Self-pay | Admitting: Obstetrics and Gynecology

## 2018-07-22 DIAGNOSIS — N6310 Unspecified lump in the right breast, unspecified quadrant: Secondary | ICD-10-CM

## 2018-07-25 IMAGING — MR MR CERVICAL SPINE W/O CM
5 series · 36 of 48 positions shown · non-contrast
Comparison: None.

CLINICAL DATA: Neck pain.  Shoulder pain bilaterally.

EXAM:
MRI CERVICAL SPINE WITHOUT CONTRAST
TECHNIQUE: Multiplanar, multisequence MR imaging of the cervical spine was
performed. No intravenous contrast was administered.

[Series 5: T1 · sagittal · 3.0mm · 0.66mm/px · 6 of 15 slices shown]
[im 1/15]
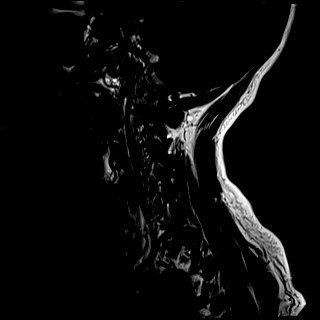
[im 3/15]
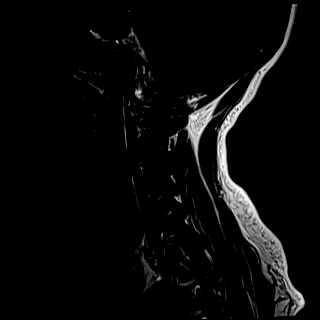
[im 6/15]
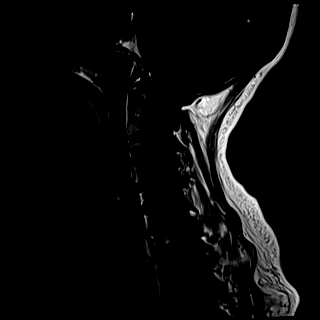
[im 9/15]
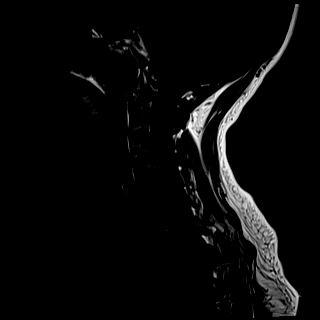
[im 12/15]
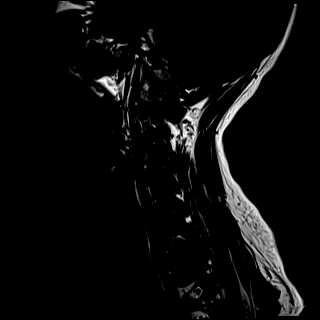
[im 15/15]
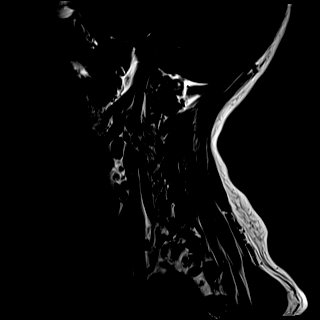

[Series 6: T2 · sagittal · 3.0mm · 0.66mm/px · 7 of 15 slices shown (1 of 2)]
[im 1/15]
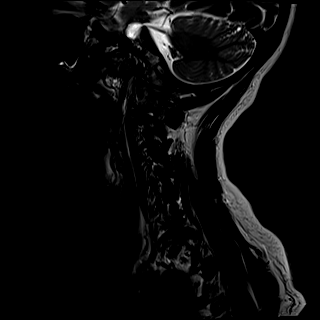
[im 3/15]
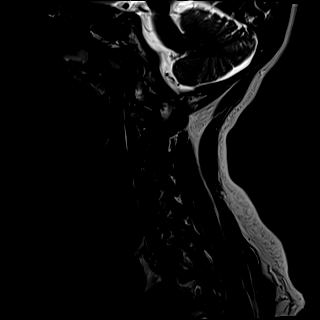
[im 5/15]
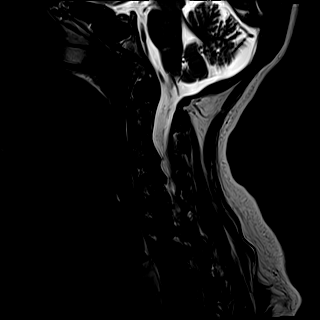
[im 8/15]
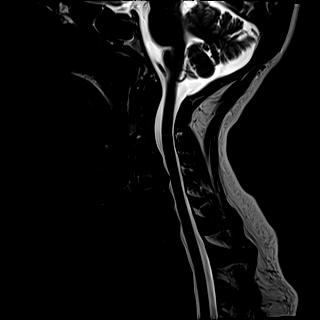
[im 10/15]
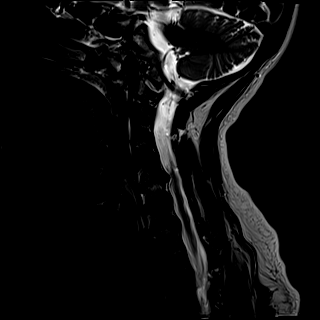
[im 12/15]
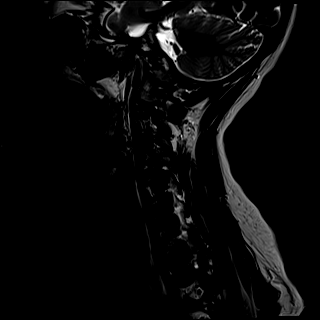
[im 15/15]
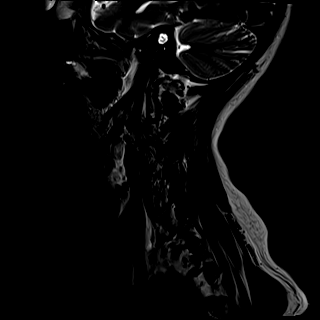

[Series 7: STIR · sagittal · 3.0mm · 0.41mm/px · 7 of 15 slices shown]
[im 1/15]
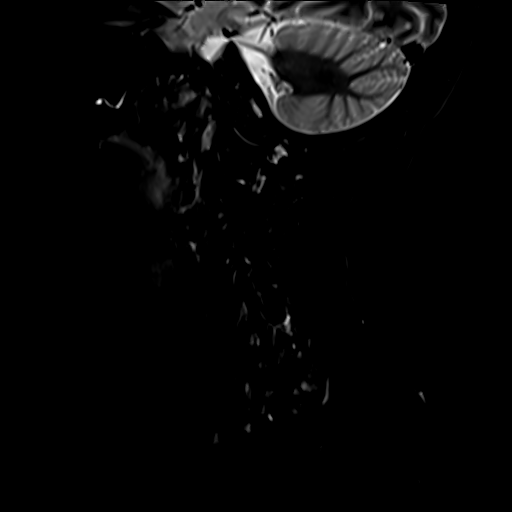
[im 3/15]
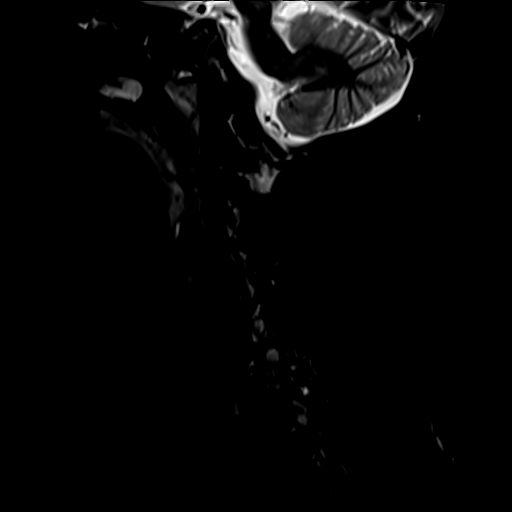
[im 5/15]
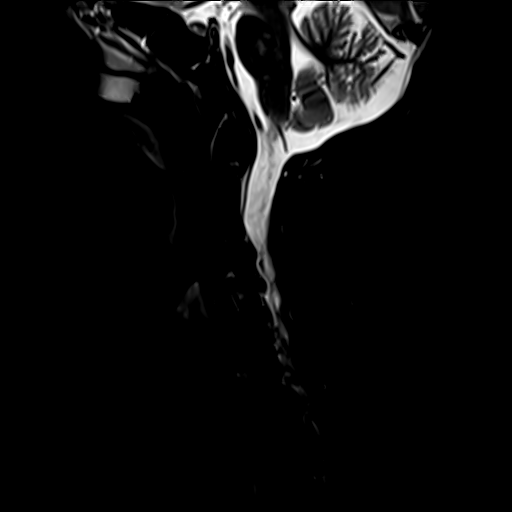
[im 8/15]
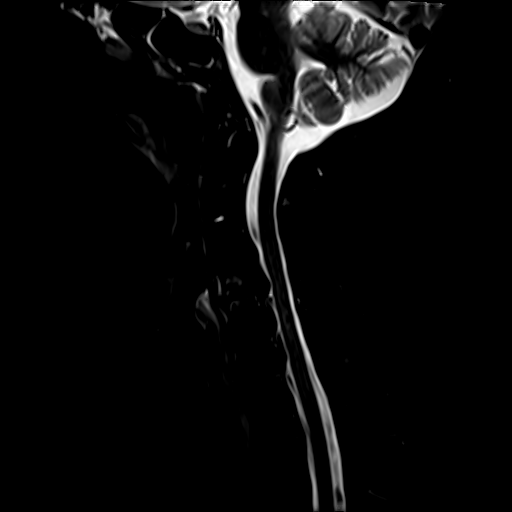
[im 10/15]
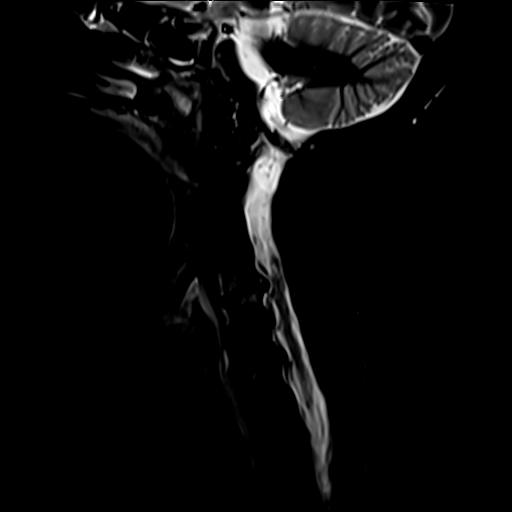
[im 12/15]
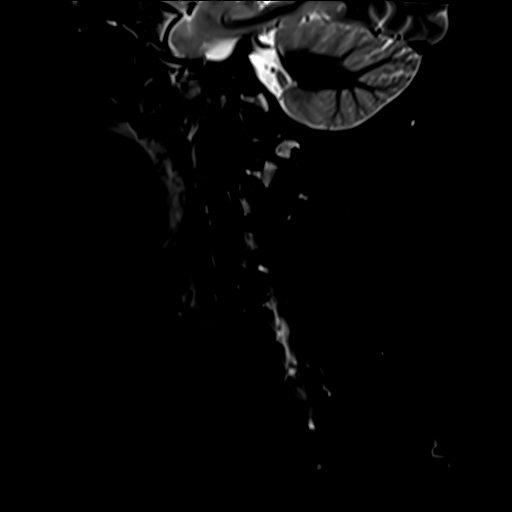
[im 15/15]
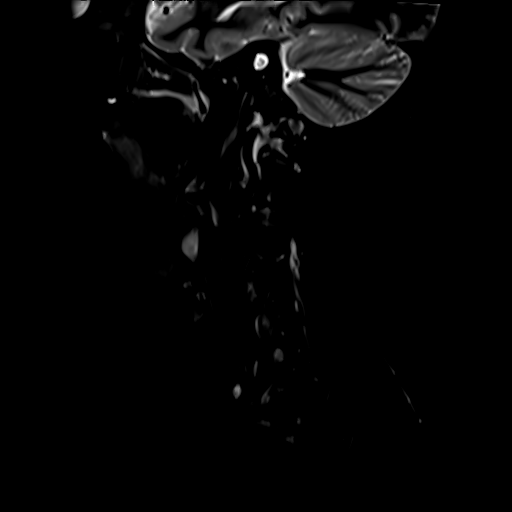

[Series 8: T2 · axial · 3.0mm · 0.62mm/px · z∈[-76,+13]mm · 8 of 30 slices shown (2 of 2)]
[im 1/30]
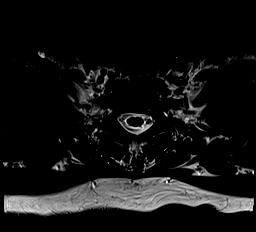
[im 5/30]
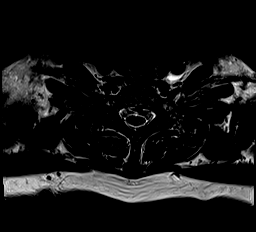
[im 9/30]
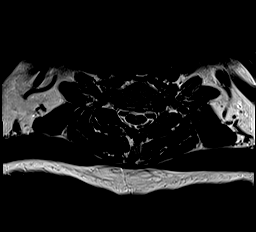
[im 14/30]
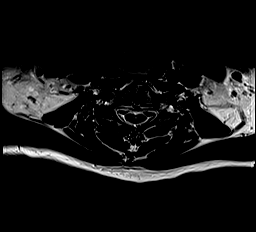
[im 16/30]
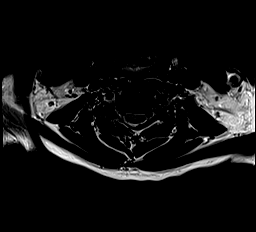
[im 21/30]
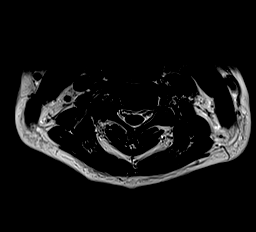
[im 25/30]
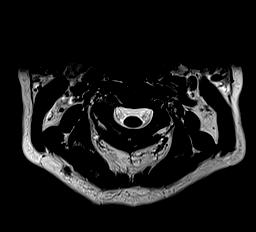
[im 30/30]
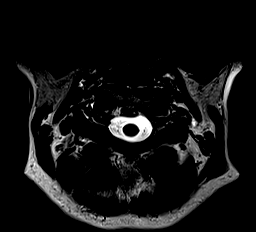

[Series 9: GRE · axial · 3.0mm · 0.42mm/px · z∈[-76,+13]mm · 8 of 30 slices shown]
[im 1/30]
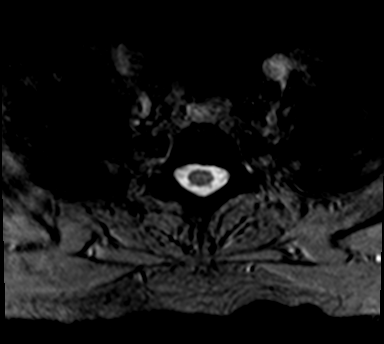
[im 5/30]
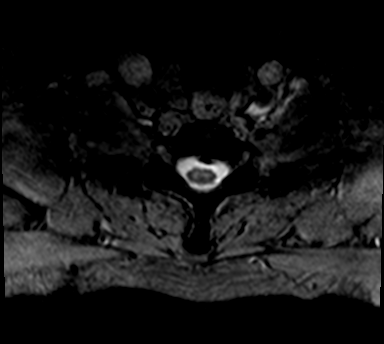
[im 9/30]
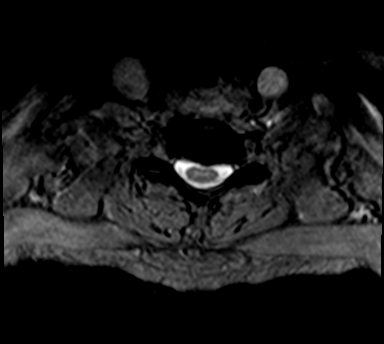
[im 14/30]
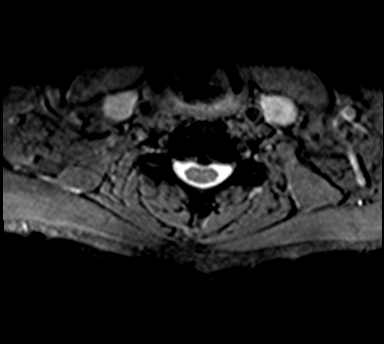
[im 16/30]
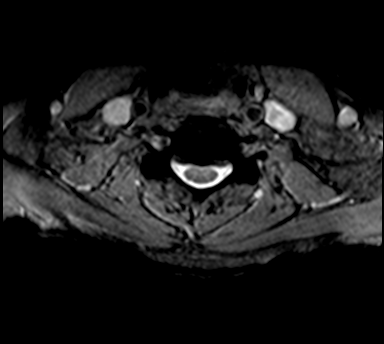
[im 21/30]
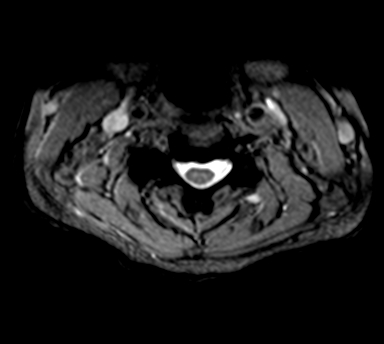
[im 25/30]
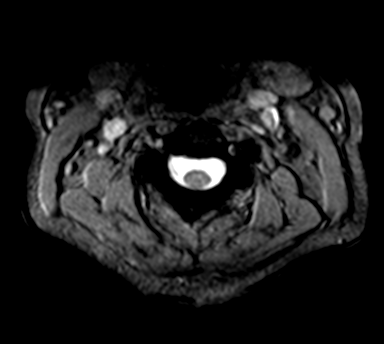
[im 30/30]
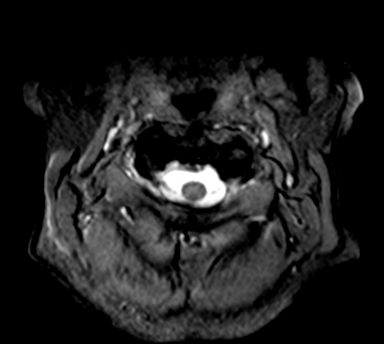

[36 of 48 positions shown; findings below may reference images not displayed]

FINDINGS: Alignment: Slight C7-T1 facet mediated anterolisthesis.

Vertebrae: No fracture, evidence of discitis, or bone lesion.

Cord: Normal signal and morphology.

Posterior Fossa, vertebral arteries, paraspinal tissues: Patchy T2
hyperintensity in the pons, usually from chronic microvascular
disease.

Disc levels:

C2-3: Facet arthropathy with spurring on the right. no impingement

C3-4: Mild disc narrowing and bulging. Right-sided uncovertebral
ridging that is bulky. Facet arthropathy with asymmetric right-sided
spurring. Severe right foraminal stenosis. Patent left foramen and
canal.

C4-5: Greatest level degenerative disc narrowing with bilateral
uncovertebral and endplate ridging. Disc bulging. Essentially
negative facets. Ventral subarachnoid space effacement without cord
compression. Bilateral stenosis with C5 impingement

C5-6: Disc narrowing with endplate and uncovertebral ridging.
Negative facets. Partial effacement of ventral CSF. Mild right
foraminal narrowing

C6-7: Disc narrowing and bulging with small uncovertebral spurs.
Negative facets. Patent canal and foramina

C7-T1:Facet arthropathy with joint distortion and spurring on the
right. Slight anterolisthesis. Patent canal and foramina.
IMPRESSION: 1. Multilevel cervical disc and facet degeneration as described.
2. C3-4 right and C4-5 bilateral advanced foraminal stenosis. C5-6
mild right foraminal stenosis.
3. Patent spinal canal.

## 2018-07-25 IMAGING — MR MR KNEE*R* W/O CM
6 series · 40 of 40 positions shown · non-contrast
Comparison: None.

CLINICAL DATA: Chronic right knee pain. History of surgery as an
16-year-old. No recent injury. Initial encounter.

EXAM:
MRI OF THE RIGHT KNEE WITHOUT CONTRAST
TECHNIQUE: Multiplanar, multisequence MR imaging of the knee was performed. No
intravenous contrast was administered.

[Series 5: PD fat-sat · axial · right · 3.0mm · 0.47mm/px · z∈[-54,+61]mm · 9 of 36 slices shown (1 of 3)]
[im 1/36]
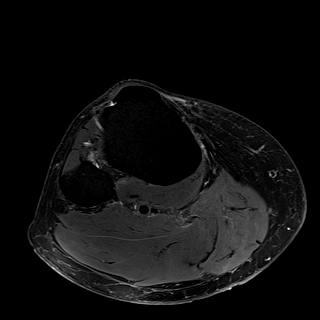
[im 5/36]
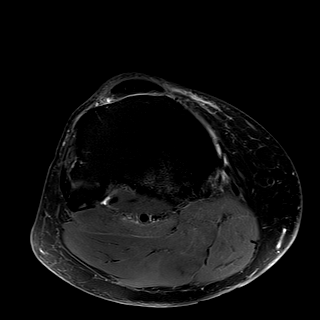
[im 9/36]
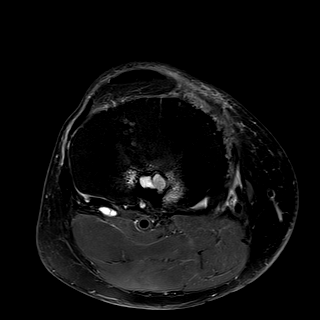
[im 14/36]
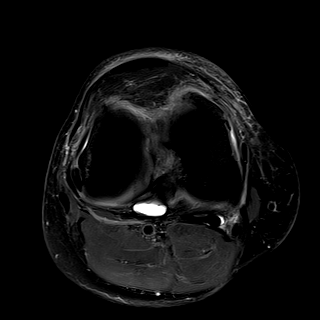
[im 18/36]
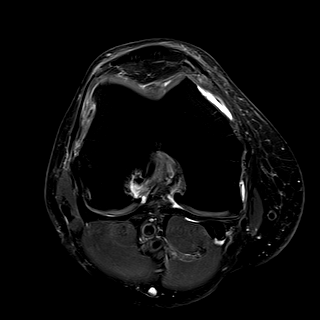
[im 22/36]
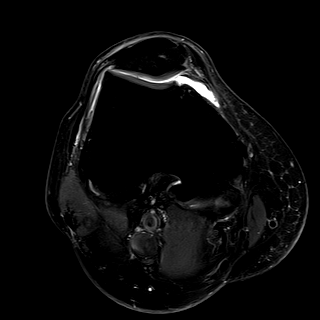
[im 27/36]
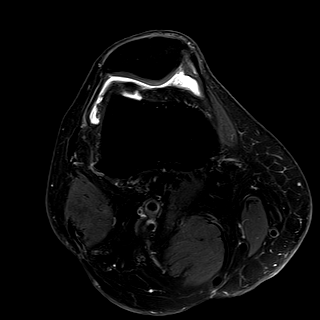
[im 31/36]
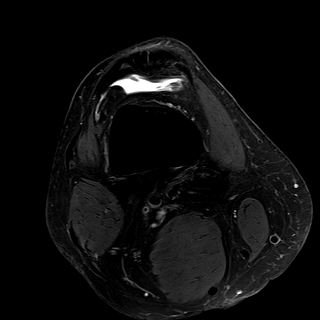
[im 36/36]
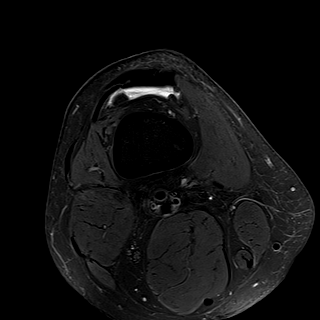

[Series 6: PD fat-sat · sagittal · right · 3.0mm · 0.47mm/px · 6 of 27 slices shown (2 of 3)]
[im 1/27]
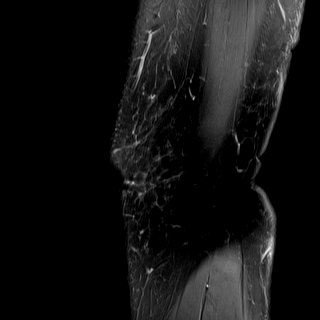
[im 6/27]
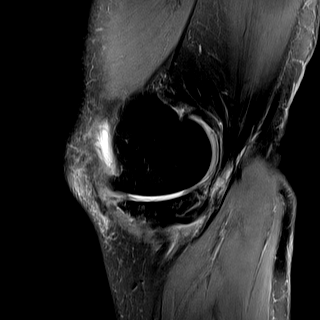
[im 11/27]
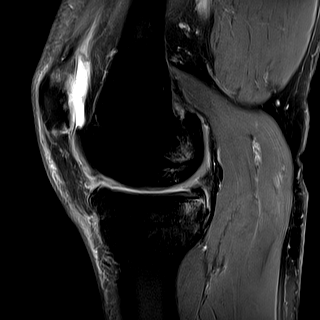
[im 16/27]
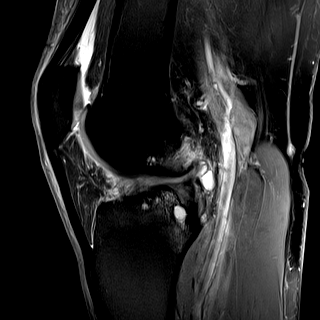
[im 21/27]
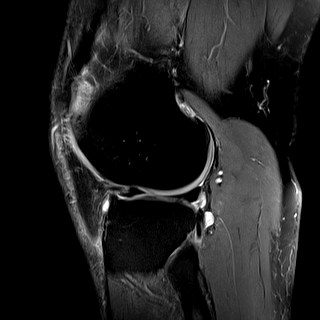
[im 27/27]
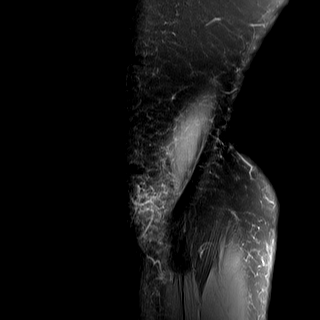

[Series 7: T1 · coronal · right · 3.0mm · 0.47mm/px · 7 of 30 slices shown]
[im 1/30]
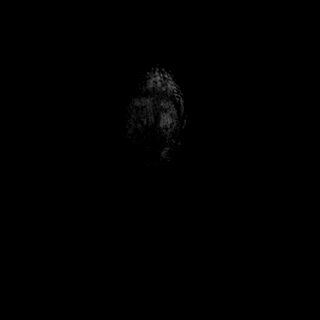
[im 5/30]
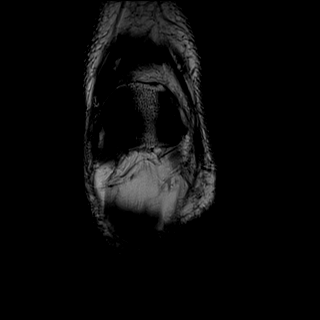
[im 10/30]
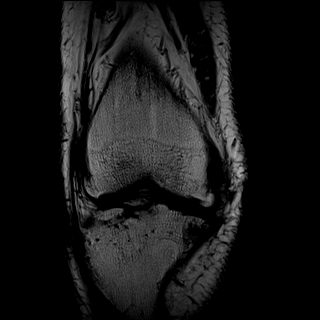
[im 15/30]
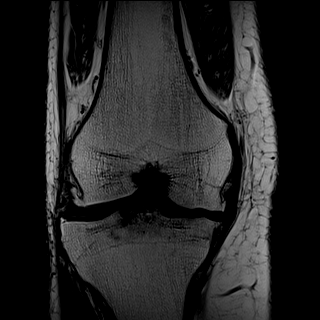
[im 20/30]
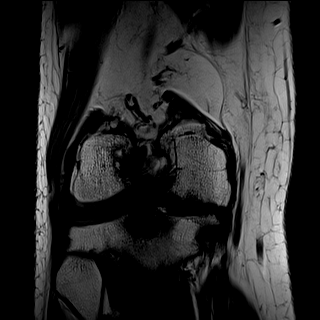
[im 25/30]
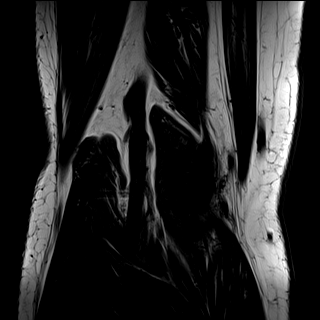
[im 30/30]
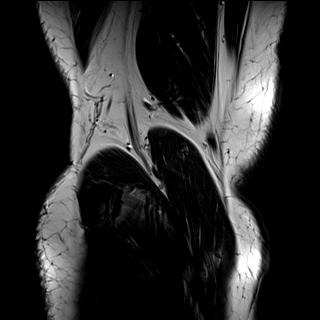

[Series 9: T2 fat-sat · coronal · right · 3.0mm · 0.47mm/px · 7 of 30 slices shown]
[im 1/30]
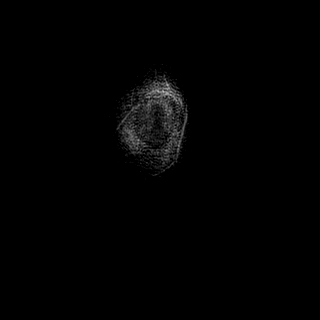
[im 5/30]
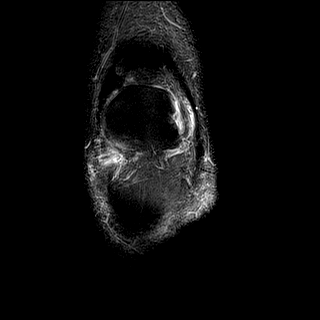
[im 10/30]
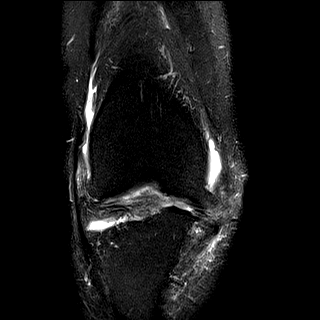
[im 15/30]
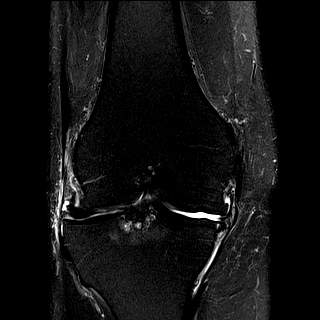
[im 20/30]
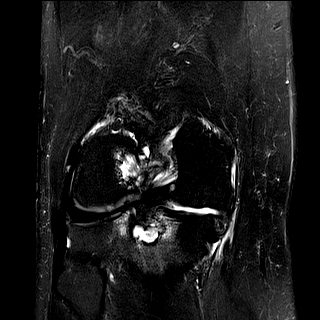
[im 25/30]
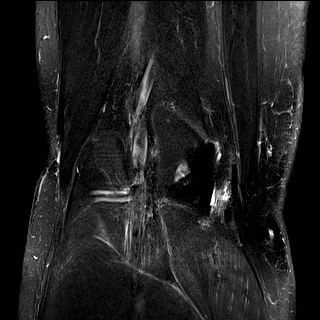
[im 30/30]
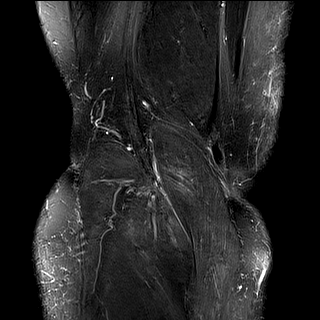

[Series 10: PD · coronal · right · 1.5mm · 0.55mm/px · 4 of 15 slices shown]
[im 1/15]
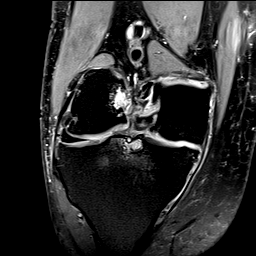
[im 5/15]
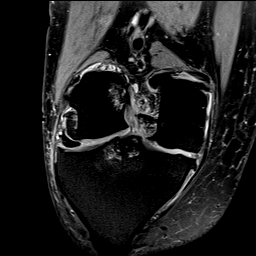
[im 10/15]
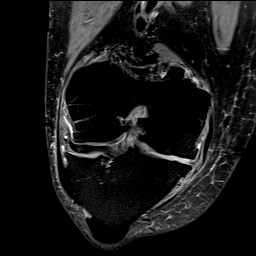
[im 15/15]
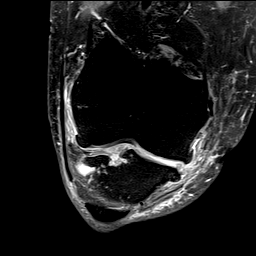

[Series 11: PD fat-sat · coronal · right · 3.0mm · 0.47mm/px · 7 of 30 slices shown (3 of 3)]
[im 1/30]
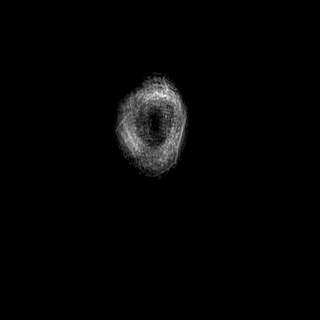
[im 5/30]
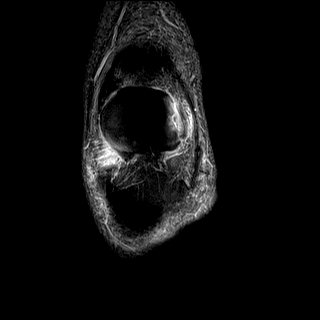
[im 10/30]
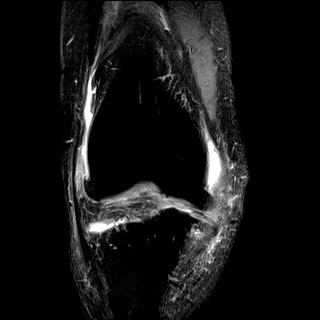
[im 15/30]
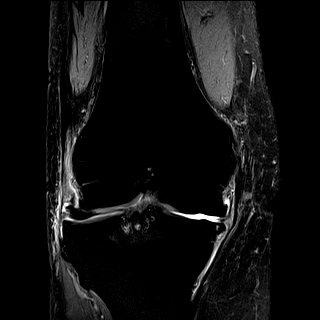
[im 20/30]
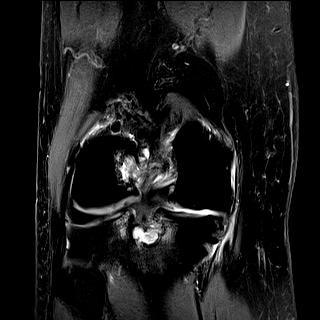
[im 25/30]
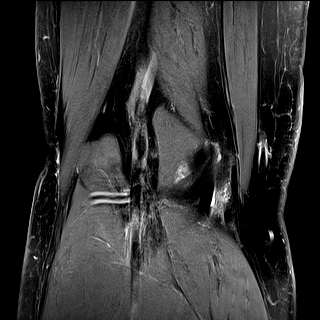
[im 30/30]
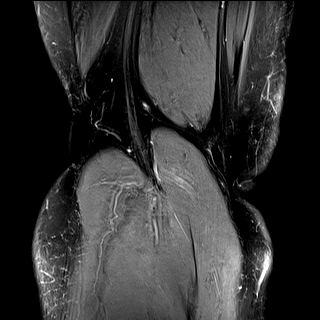

[40 of 40 positions shown; findings below may reference images not displayed]

FINDINGS: MENISCI

Medial meniscus: The anterior and posterior horns are not visualized
which may be postoperative or due to degenerative maceration. The
body is severely degenerated and markedly diminutive. No
normal-appearing meniscal tissue is seen medially.

Lateral meniscus: A focal radial tear is seen along the free edge of
the lateral meniscus just posterior to the junction of the body and
anterior horn.

LIGAMENTS

Cruciates: ACL worse than PCL mucoid degeneration is identified. PCL
ganglion cyst measures 1.5 cm transverse by 1.6 cm craniocaudal by
0.8 cm AP. The ligaments are intact.

Collaterals:  Intact.

CARTILAGE

Patellofemoral: Small focal defect is seen in the inferior pole of
the lateral facet. No full-thickness defect or reactive marrow
signal change.

Medial: Cartilage appears completely denuded with associated joint
space narrowing.

Lateral:  Mildly degenerated and irregular.

Joint:  Small joint effusion.

Popliteal Fossa:  No Baker's cyst.

Extensor Mechanism:  Intact.

Bones: No fracture or worrisome marrow lesion. Prominent
tricompartmental osteophytes are identified. Cystic change in the
femur and tibia at the ACL attachment sites is consistent with
change related to ACL degeneration.

Other: None
IMPRESSION: Osteoarthritis appears asymmetrically worst in the medial
compartment where it is severe. The anterior and posterior horns of
the medial meniscus are not visualized consistent with either prior
surgery or degenerative maceration. No normal-appearing tissue in
the meniscal body is present.

Focal radial tear of the body of the lateral meniscus just posterior
to junction of the body and anterior horn.

ACL worse than PCL mucoid degeneration.  PCL ganglion cyst.

## 2018-07-27 ENCOUNTER — Other Ambulatory Visit: Payer: Self-pay

## 2018-07-27 ENCOUNTER — Ambulatory Visit
Admission: RE | Admit: 2018-07-27 | Discharge: 2018-07-27 | Disposition: A | Discharge status: Home / Self Care | Payer: BC Managed Care – PPO | Source: Ambulatory Visit | Attending: Obstetrics and Gynecology | Admitting: Obstetrics and Gynecology

## 2018-07-27 ENCOUNTER — Other Ambulatory Visit: Payer: Self-pay | Admitting: Obstetrics and Gynecology

## 2018-07-27 DIAGNOSIS — N6310 Unspecified lump in the right breast, unspecified quadrant: Secondary | ICD-10-CM

## 2019-08-11 ENCOUNTER — Other Ambulatory Visit: Payer: Self-pay | Admitting: Obstetrics and Gynecology

## 2019-08-11 DIAGNOSIS — N63 Unspecified lump in unspecified breast: Secondary | ICD-10-CM

## 2019-08-26 ENCOUNTER — Other Ambulatory Visit: Payer: BC Managed Care – PPO

## 2019-12-05 LAB — EXTERNAL GENERIC LAB PROCEDURE: COLOGUARD: NEGATIVE

## 2019-12-13 DIAGNOSIS — J301 Allergic rhinitis due to pollen: Secondary | ICD-10-CM | POA: Diagnosis not present

## 2019-12-13 DIAGNOSIS — J3081 Allergic rhinitis due to animal (cat) (dog) hair and dander: Secondary | ICD-10-CM | POA: Diagnosis not present

## 2019-12-13 DIAGNOSIS — J3089 Other allergic rhinitis: Secondary | ICD-10-CM | POA: Diagnosis not present

## 2019-12-13 DIAGNOSIS — M25562 Pain in left knee: Secondary | ICD-10-CM | POA: Diagnosis not present

## 2019-12-23 ENCOUNTER — Telehealth: Payer: Self-pay | Admitting: Obstetrics and Gynecology

## 2019-12-23 DIAGNOSIS — J301 Allergic rhinitis due to pollen: Secondary | ICD-10-CM | POA: Diagnosis not present

## 2019-12-23 DIAGNOSIS — J3081 Allergic rhinitis due to animal (cat) (dog) hair and dander: Secondary | ICD-10-CM | POA: Diagnosis not present

## 2019-12-23 DIAGNOSIS — J3089 Other allergic rhinitis: Secondary | ICD-10-CM | POA: Diagnosis not present

## 2019-12-23 NOTE — Telephone Encounter (Signed)
Patient called wanting to schedule a new patient appointment. She said that Amy Excell Seltzer sees you and recommends her to establish with you.  Please advise

## 2019-12-23 NOTE — Telephone Encounter (Signed)
Sorry but I am too full  

## 2019-12-27 DIAGNOSIS — J301 Allergic rhinitis due to pollen: Secondary | ICD-10-CM | POA: Diagnosis not present

## 2019-12-27 DIAGNOSIS — J3081 Allergic rhinitis due to animal (cat) (dog) hair and dander: Secondary | ICD-10-CM | POA: Diagnosis not present

## 2019-12-27 DIAGNOSIS — J3089 Other allergic rhinitis: Secondary | ICD-10-CM | POA: Diagnosis not present

## 2019-12-29 DIAGNOSIS — N819 Female genital prolapse, unspecified: Secondary | ICD-10-CM | POA: Diagnosis not present

## 2019-12-30 DIAGNOSIS — M25562 Pain in left knee: Secondary | ICD-10-CM | POA: Diagnosis not present

## 2020-01-11 NOTE — Telephone Encounter (Signed)
I advised the patient and she is going to do some research and then call us back.

## 2020-01-24 DIAGNOSIS — R946 Abnormal results of thyroid function studies: Secondary | ICD-10-CM | POA: Diagnosis not present

## 2020-01-24 DIAGNOSIS — R5383 Other fatigue: Secondary | ICD-10-CM | POA: Diagnosis not present

## 2020-01-24 DIAGNOSIS — R7989 Other specified abnormal findings of blood chemistry: Secondary | ICD-10-CM | POA: Diagnosis not present

## 2020-01-24 DIAGNOSIS — E785 Hyperlipidemia, unspecified: Secondary | ICD-10-CM | POA: Diagnosis not present

## 2020-01-31 DIAGNOSIS — N8189 Other female genital prolapse: Secondary | ICD-10-CM | POA: Diagnosis not present

## 2020-01-31 DIAGNOSIS — M1712 Unilateral primary osteoarthritis, left knee: Secondary | ICD-10-CM | POA: Diagnosis not present

## 2020-01-31 DIAGNOSIS — Z96651 Presence of right artificial knee joint: Secondary | ICD-10-CM | POA: Diagnosis not present

## 2020-01-31 DIAGNOSIS — G43009 Migraine without aura, not intractable, without status migrainosus: Secondary | ICD-10-CM | POA: Diagnosis not present

## 2020-01-31 DIAGNOSIS — E039 Hypothyroidism, unspecified: Secondary | ICD-10-CM | POA: Diagnosis not present

## 2020-01-31 DIAGNOSIS — E7800 Pure hypercholesterolemia, unspecified: Secondary | ICD-10-CM | POA: Diagnosis not present

## 2020-02-03 DIAGNOSIS — J301 Allergic rhinitis due to pollen: Secondary | ICD-10-CM | POA: Diagnosis not present

## 2020-02-03 DIAGNOSIS — J3089 Other allergic rhinitis: Secondary | ICD-10-CM | POA: Diagnosis not present

## 2020-02-03 DIAGNOSIS — J3081 Allergic rhinitis due to animal (cat) (dog) hair and dander: Secondary | ICD-10-CM | POA: Diagnosis not present

## 2020-02-08 DIAGNOSIS — J301 Allergic rhinitis due to pollen: Secondary | ICD-10-CM | POA: Diagnosis not present

## 2020-02-08 DIAGNOSIS — J3081 Allergic rhinitis due to animal (cat) (dog) hair and dander: Secondary | ICD-10-CM | POA: Diagnosis not present

## 2020-02-08 DIAGNOSIS — J3089 Other allergic rhinitis: Secondary | ICD-10-CM | POA: Diagnosis not present

## 2020-02-13 DIAGNOSIS — E782 Mixed hyperlipidemia: Secondary | ICD-10-CM | POA: Diagnosis not present

## 2020-02-13 DIAGNOSIS — F909 Attention-deficit hyperactivity disorder, unspecified type: Secondary | ICD-10-CM | POA: Diagnosis not present

## 2020-02-13 DIAGNOSIS — E559 Vitamin D deficiency, unspecified: Secondary | ICD-10-CM | POA: Diagnosis not present

## 2020-02-13 DIAGNOSIS — M542 Cervicalgia: Secondary | ICD-10-CM | POA: Diagnosis not present

## 2020-02-13 DIAGNOSIS — M79644 Pain in right finger(s): Secondary | ICD-10-CM | POA: Diagnosis not present

## 2020-02-13 DIAGNOSIS — E039 Hypothyroidism, unspecified: Secondary | ICD-10-CM | POA: Diagnosis not present

## 2020-02-22 DIAGNOSIS — J3089 Other allergic rhinitis: Secondary | ICD-10-CM | POA: Diagnosis not present

## 2020-02-22 DIAGNOSIS — M1712 Unilateral primary osteoarthritis, left knee: Secondary | ICD-10-CM | POA: Diagnosis not present

## 2020-02-22 DIAGNOSIS — J301 Allergic rhinitis due to pollen: Secondary | ICD-10-CM | POA: Diagnosis not present

## 2020-02-22 DIAGNOSIS — E7800 Pure hypercholesterolemia, unspecified: Secondary | ICD-10-CM | POA: Diagnosis not present

## 2020-02-22 DIAGNOSIS — E039 Hypothyroidism, unspecified: Secondary | ICD-10-CM | POA: Diagnosis not present

## 2020-02-22 DIAGNOSIS — J3081 Allergic rhinitis due to animal (cat) (dog) hair and dander: Secondary | ICD-10-CM | POA: Diagnosis not present

## 2020-02-29 DIAGNOSIS — J3089 Other allergic rhinitis: Secondary | ICD-10-CM | POA: Diagnosis not present

## 2020-02-29 DIAGNOSIS — J301 Allergic rhinitis due to pollen: Secondary | ICD-10-CM | POA: Diagnosis not present

## 2020-02-29 DIAGNOSIS — J3081 Allergic rhinitis due to animal (cat) (dog) hair and dander: Secondary | ICD-10-CM | POA: Diagnosis not present

## 2020-02-29 DIAGNOSIS — S031XXA Dislocation of septal cartilage of nose, initial encounter: Secondary | ICD-10-CM | POA: Diagnosis not present

## 2020-03-05 DIAGNOSIS — J3089 Other allergic rhinitis: Secondary | ICD-10-CM | POA: Diagnosis not present

## 2020-03-05 DIAGNOSIS — J301 Allergic rhinitis due to pollen: Secondary | ICD-10-CM | POA: Diagnosis not present

## 2020-03-05 DIAGNOSIS — J3081 Allergic rhinitis due to animal (cat) (dog) hair and dander: Secondary | ICD-10-CM | POA: Diagnosis not present

## 2020-03-07 DIAGNOSIS — J3089 Other allergic rhinitis: Secondary | ICD-10-CM | POA: Diagnosis not present

## 2020-03-07 DIAGNOSIS — J301 Allergic rhinitis due to pollen: Secondary | ICD-10-CM | POA: Diagnosis not present

## 2020-03-07 DIAGNOSIS — J3081 Allergic rhinitis due to animal (cat) (dog) hair and dander: Secondary | ICD-10-CM | POA: Diagnosis not present

## 2020-03-13 DIAGNOSIS — M25511 Pain in right shoulder: Secondary | ICD-10-CM | POA: Diagnosis not present

## 2020-03-13 DIAGNOSIS — S83242A Other tear of medial meniscus, current injury, left knee, initial encounter: Secondary | ICD-10-CM | POA: Diagnosis not present

## 2020-03-13 DIAGNOSIS — M75101 Unspecified rotator cuff tear or rupture of right shoulder, not specified as traumatic: Secondary | ICD-10-CM | POA: Diagnosis not present

## 2020-03-16 DIAGNOSIS — J3081 Allergic rhinitis due to animal (cat) (dog) hair and dander: Secondary | ICD-10-CM | POA: Diagnosis not present

## 2020-03-16 DIAGNOSIS — J3089 Other allergic rhinitis: Secondary | ICD-10-CM | POA: Diagnosis not present

## 2020-03-16 DIAGNOSIS — J301 Allergic rhinitis due to pollen: Secondary | ICD-10-CM | POA: Diagnosis not present

## 2020-03-29 DIAGNOSIS — J3081 Allergic rhinitis due to animal (cat) (dog) hair and dander: Secondary | ICD-10-CM | POA: Diagnosis not present

## 2020-03-29 DIAGNOSIS — J301 Allergic rhinitis due to pollen: Secondary | ICD-10-CM | POA: Diagnosis not present

## 2020-03-29 DIAGNOSIS — J3089 Other allergic rhinitis: Secondary | ICD-10-CM | POA: Diagnosis not present

## 2020-04-04 DIAGNOSIS — L814 Other melanin hyperpigmentation: Secondary | ICD-10-CM | POA: Diagnosis not present

## 2020-04-04 DIAGNOSIS — Z85828 Personal history of other malignant neoplasm of skin: Secondary | ICD-10-CM | POA: Diagnosis not present

## 2020-04-04 DIAGNOSIS — L738 Other specified follicular disorders: Secondary | ICD-10-CM | POA: Diagnosis not present

## 2020-04-04 DIAGNOSIS — D225 Melanocytic nevi of trunk: Secondary | ICD-10-CM | POA: Diagnosis not present

## 2020-04-04 DIAGNOSIS — L570 Actinic keratosis: Secondary | ICD-10-CM | POA: Diagnosis not present

## 2020-04-04 DIAGNOSIS — L821 Other seborrheic keratosis: Secondary | ICD-10-CM | POA: Diagnosis not present

## 2020-04-09 DIAGNOSIS — J301 Allergic rhinitis due to pollen: Secondary | ICD-10-CM | POA: Diagnosis not present

## 2020-04-09 DIAGNOSIS — J3089 Other allergic rhinitis: Secondary | ICD-10-CM | POA: Diagnosis not present

## 2020-04-09 DIAGNOSIS — J3081 Allergic rhinitis due to animal (cat) (dog) hair and dander: Secondary | ICD-10-CM | POA: Diagnosis not present

## 2020-04-12 DIAGNOSIS — M1712 Unilateral primary osteoarthritis, left knee: Secondary | ICD-10-CM | POA: Diagnosis not present

## 2020-04-16 DIAGNOSIS — J3089 Other allergic rhinitis: Secondary | ICD-10-CM | POA: Diagnosis not present

## 2020-04-16 DIAGNOSIS — J3081 Allergic rhinitis due to animal (cat) (dog) hair and dander: Secondary | ICD-10-CM | POA: Diagnosis not present

## 2020-04-16 DIAGNOSIS — J301 Allergic rhinitis due to pollen: Secondary | ICD-10-CM | POA: Diagnosis not present

## 2020-04-17 DIAGNOSIS — F988 Other specified behavioral and emotional disorders with onset usually occurring in childhood and adolescence: Secondary | ICD-10-CM | POA: Diagnosis not present

## 2020-04-17 DIAGNOSIS — G43009 Migraine without aura, not intractable, without status migrainosus: Secondary | ICD-10-CM | POA: Diagnosis not present

## 2020-04-17 DIAGNOSIS — Z0000 Encounter for general adult medical examination without abnormal findings: Secondary | ICD-10-CM | POA: Diagnosis not present

## 2020-04-17 DIAGNOSIS — E039 Hypothyroidism, unspecified: Secondary | ICD-10-CM | POA: Diagnosis not present

## 2020-04-17 DIAGNOSIS — E7800 Pure hypercholesterolemia, unspecified: Secondary | ICD-10-CM | POA: Diagnosis not present

## 2020-04-30 DIAGNOSIS — J3089 Other allergic rhinitis: Secondary | ICD-10-CM | POA: Diagnosis not present

## 2020-04-30 DIAGNOSIS — J3081 Allergic rhinitis due to animal (cat) (dog) hair and dander: Secondary | ICD-10-CM | POA: Diagnosis not present

## 2020-04-30 DIAGNOSIS — J301 Allergic rhinitis due to pollen: Secondary | ICD-10-CM | POA: Diagnosis not present

## 2020-05-02 DIAGNOSIS — H16223 Keratoconjunctivitis sicca, not specified as Sjogren's, bilateral: Secondary | ICD-10-CM | POA: Diagnosis not present

## 2020-06-12 DIAGNOSIS — M13841 Other specified arthritis, right hand: Secondary | ICD-10-CM | POA: Diagnosis not present

## 2020-06-12 DIAGNOSIS — M170 Bilateral primary osteoarthritis of knee: Secondary | ICD-10-CM | POA: Diagnosis not present

## 2020-06-12 DIAGNOSIS — M25511 Pain in right shoulder: Secondary | ICD-10-CM | POA: Diagnosis not present

## 2020-06-14 DIAGNOSIS — J3089 Other allergic rhinitis: Secondary | ICD-10-CM | POA: Diagnosis not present

## 2020-06-14 DIAGNOSIS — J301 Allergic rhinitis due to pollen: Secondary | ICD-10-CM | POA: Diagnosis not present

## 2020-06-14 DIAGNOSIS — J3081 Allergic rhinitis due to animal (cat) (dog) hair and dander: Secondary | ICD-10-CM | POA: Diagnosis not present

## 2020-06-18 DIAGNOSIS — J3081 Allergic rhinitis due to animal (cat) (dog) hair and dander: Secondary | ICD-10-CM | POA: Diagnosis not present

## 2020-06-18 DIAGNOSIS — J3089 Other allergic rhinitis: Secondary | ICD-10-CM | POA: Diagnosis not present

## 2020-06-18 DIAGNOSIS — J301 Allergic rhinitis due to pollen: Secondary | ICD-10-CM | POA: Diagnosis not present

## 2020-06-25 DIAGNOSIS — M25662 Stiffness of left knee, not elsewhere classified: Secondary | ICD-10-CM | POA: Diagnosis not present

## 2020-06-25 DIAGNOSIS — M25562 Pain in left knee: Secondary | ICD-10-CM | POA: Diagnosis not present

## 2020-06-28 DIAGNOSIS — J301 Allergic rhinitis due to pollen: Secondary | ICD-10-CM | POA: Diagnosis not present

## 2020-06-28 DIAGNOSIS — J3089 Other allergic rhinitis: Secondary | ICD-10-CM | POA: Diagnosis not present

## 2020-06-28 DIAGNOSIS — J3081 Allergic rhinitis due to animal (cat) (dog) hair and dander: Secondary | ICD-10-CM | POA: Diagnosis not present

## 2020-07-09 DIAGNOSIS — J301 Allergic rhinitis due to pollen: Secondary | ICD-10-CM | POA: Diagnosis not present

## 2020-07-09 DIAGNOSIS — J3081 Allergic rhinitis due to animal (cat) (dog) hair and dander: Secondary | ICD-10-CM | POA: Diagnosis not present

## 2020-07-09 DIAGNOSIS — J3089 Other allergic rhinitis: Secondary | ICD-10-CM | POA: Diagnosis not present

## 2020-07-19 DIAGNOSIS — M1712 Unilateral primary osteoarthritis, left knee: Secondary | ICD-10-CM | POA: Diagnosis not present

## 2020-07-26 DIAGNOSIS — M1712 Unilateral primary osteoarthritis, left knee: Secondary | ICD-10-CM | POA: Diagnosis not present

## 2020-07-31 DIAGNOSIS — J3081 Allergic rhinitis due to animal (cat) (dog) hair and dander: Secondary | ICD-10-CM | POA: Diagnosis not present

## 2020-07-31 DIAGNOSIS — J3089 Other allergic rhinitis: Secondary | ICD-10-CM | POA: Diagnosis not present

## 2020-07-31 DIAGNOSIS — J301 Allergic rhinitis due to pollen: Secondary | ICD-10-CM | POA: Diagnosis not present

## 2020-08-02 DIAGNOSIS — J3089 Other allergic rhinitis: Secondary | ICD-10-CM | POA: Diagnosis not present

## 2020-08-02 DIAGNOSIS — J3081 Allergic rhinitis due to animal (cat) (dog) hair and dander: Secondary | ICD-10-CM | POA: Diagnosis not present

## 2020-08-02 DIAGNOSIS — J301 Allergic rhinitis due to pollen: Secondary | ICD-10-CM | POA: Diagnosis not present

## 2020-08-02 DIAGNOSIS — M1712 Unilateral primary osteoarthritis, left knee: Secondary | ICD-10-CM | POA: Diagnosis not present

## 2020-08-08 DIAGNOSIS — J3089 Other allergic rhinitis: Secondary | ICD-10-CM | POA: Diagnosis not present

## 2020-08-08 DIAGNOSIS — E039 Hypothyroidism, unspecified: Secondary | ICD-10-CM | POA: Diagnosis not present

## 2020-08-08 DIAGNOSIS — J3081 Allergic rhinitis due to animal (cat) (dog) hair and dander: Secondary | ICD-10-CM | POA: Diagnosis not present

## 2020-08-08 DIAGNOSIS — E7800 Pure hypercholesterolemia, unspecified: Secondary | ICD-10-CM | POA: Diagnosis not present

## 2020-08-08 DIAGNOSIS — J301 Allergic rhinitis due to pollen: Secondary | ICD-10-CM | POA: Diagnosis not present

## 2020-08-14 DIAGNOSIS — M797 Fibromyalgia: Secondary | ICD-10-CM | POA: Diagnosis not present

## 2020-08-14 DIAGNOSIS — E039 Hypothyroidism, unspecified: Secondary | ICD-10-CM | POA: Diagnosis not present

## 2020-08-14 DIAGNOSIS — E7800 Pure hypercholesterolemia, unspecified: Secondary | ICD-10-CM | POA: Diagnosis not present

## 2020-08-14 DIAGNOSIS — F988 Other specified behavioral and emotional disorders with onset usually occurring in childhood and adolescence: Secondary | ICD-10-CM | POA: Diagnosis not present

## 2020-08-17 DIAGNOSIS — J3089 Other allergic rhinitis: Secondary | ICD-10-CM | POA: Diagnosis not present

## 2020-08-17 DIAGNOSIS — J301 Allergic rhinitis due to pollen: Secondary | ICD-10-CM | POA: Diagnosis not present

## 2020-08-17 DIAGNOSIS — J3081 Allergic rhinitis due to animal (cat) (dog) hair and dander: Secondary | ICD-10-CM | POA: Diagnosis not present

## 2020-08-21 DIAGNOSIS — N958 Other specified menopausal and perimenopausal disorders: Secondary | ICD-10-CM | POA: Diagnosis not present

## 2020-08-21 DIAGNOSIS — Z6826 Body mass index (BMI) 26.0-26.9, adult: Secondary | ICD-10-CM | POA: Diagnosis not present

## 2020-08-21 DIAGNOSIS — Z1231 Encounter for screening mammogram for malignant neoplasm of breast: Secondary | ICD-10-CM | POA: Diagnosis not present

## 2020-08-21 DIAGNOSIS — Z124 Encounter for screening for malignant neoplasm of cervix: Secondary | ICD-10-CM | POA: Diagnosis not present

## 2020-08-22 DIAGNOSIS — J3089 Other allergic rhinitis: Secondary | ICD-10-CM | POA: Diagnosis not present

## 2020-08-22 DIAGNOSIS — J3081 Allergic rhinitis due to animal (cat) (dog) hair and dander: Secondary | ICD-10-CM | POA: Diagnosis not present

## 2020-08-22 DIAGNOSIS — J301 Allergic rhinitis due to pollen: Secondary | ICD-10-CM | POA: Diagnosis not present

## 2020-08-31 DIAGNOSIS — J3089 Other allergic rhinitis: Secondary | ICD-10-CM | POA: Diagnosis not present

## 2020-08-31 DIAGNOSIS — J3081 Allergic rhinitis due to animal (cat) (dog) hair and dander: Secondary | ICD-10-CM | POA: Diagnosis not present

## 2020-08-31 DIAGNOSIS — J301 Allergic rhinitis due to pollen: Secondary | ICD-10-CM | POA: Diagnosis not present

## 2020-09-12 DIAGNOSIS — J3089 Other allergic rhinitis: Secondary | ICD-10-CM | POA: Diagnosis not present

## 2020-09-12 DIAGNOSIS — J301 Allergic rhinitis due to pollen: Secondary | ICD-10-CM | POA: Diagnosis not present

## 2020-09-12 DIAGNOSIS — J3081 Allergic rhinitis due to animal (cat) (dog) hair and dander: Secondary | ICD-10-CM | POA: Diagnosis not present

## 2020-09-14 DIAGNOSIS — U071 COVID-19: Secondary | ICD-10-CM | POA: Diagnosis not present

## 2020-09-19 DIAGNOSIS — M25562 Pain in left knee: Secondary | ICD-10-CM | POA: Diagnosis not present

## 2020-09-19 DIAGNOSIS — M25662 Stiffness of left knee, not elsewhere classified: Secondary | ICD-10-CM | POA: Diagnosis not present

## 2020-09-27 DIAGNOSIS — J3081 Allergic rhinitis due to animal (cat) (dog) hair and dander: Secondary | ICD-10-CM | POA: Diagnosis not present

## 2020-09-27 DIAGNOSIS — J3089 Other allergic rhinitis: Secondary | ICD-10-CM | POA: Diagnosis not present

## 2020-09-27 DIAGNOSIS — J301 Allergic rhinitis due to pollen: Secondary | ICD-10-CM | POA: Diagnosis not present

## 2020-10-02 DIAGNOSIS — J3081 Allergic rhinitis due to animal (cat) (dog) hair and dander: Secondary | ICD-10-CM | POA: Diagnosis not present

## 2020-10-02 DIAGNOSIS — J3489 Other specified disorders of nose and nasal sinuses: Secondary | ICD-10-CM | POA: Diagnosis not present

## 2020-10-02 DIAGNOSIS — J301 Allergic rhinitis due to pollen: Secondary | ICD-10-CM | POA: Diagnosis not present

## 2020-10-02 DIAGNOSIS — R059 Cough, unspecified: Secondary | ICD-10-CM | POA: Diagnosis not present

## 2020-10-02 DIAGNOSIS — J3089 Other allergic rhinitis: Secondary | ICD-10-CM | POA: Diagnosis not present

## 2020-10-04 DIAGNOSIS — M25562 Pain in left knee: Secondary | ICD-10-CM | POA: Diagnosis not present

## 2020-10-04 DIAGNOSIS — M25662 Stiffness of left knee, not elsewhere classified: Secondary | ICD-10-CM | POA: Diagnosis not present

## 2020-10-11 DIAGNOSIS — J301 Allergic rhinitis due to pollen: Secondary | ICD-10-CM | POA: Diagnosis not present

## 2020-10-11 DIAGNOSIS — J3089 Other allergic rhinitis: Secondary | ICD-10-CM | POA: Diagnosis not present

## 2020-10-11 DIAGNOSIS — J3081 Allergic rhinitis due to animal (cat) (dog) hair and dander: Secondary | ICD-10-CM | POA: Diagnosis not present

## 2020-10-17 DIAGNOSIS — J301 Allergic rhinitis due to pollen: Secondary | ICD-10-CM | POA: Diagnosis not present

## 2020-10-17 DIAGNOSIS — J3081 Allergic rhinitis due to animal (cat) (dog) hair and dander: Secondary | ICD-10-CM | POA: Diagnosis not present

## 2020-10-17 DIAGNOSIS — J3089 Other allergic rhinitis: Secondary | ICD-10-CM | POA: Diagnosis not present

## 2020-10-18 DIAGNOSIS — M25562 Pain in left knee: Secondary | ICD-10-CM | POA: Diagnosis not present

## 2020-10-18 DIAGNOSIS — M25662 Stiffness of left knee, not elsewhere classified: Secondary | ICD-10-CM | POA: Diagnosis not present

## 2020-10-22 DIAGNOSIS — J3081 Allergic rhinitis due to animal (cat) (dog) hair and dander: Secondary | ICD-10-CM | POA: Diagnosis not present

## 2020-10-22 DIAGNOSIS — J3089 Other allergic rhinitis: Secondary | ICD-10-CM | POA: Diagnosis not present

## 2020-10-22 DIAGNOSIS — J301 Allergic rhinitis due to pollen: Secondary | ICD-10-CM | POA: Diagnosis not present

## 2020-10-24 DIAGNOSIS — M25512 Pain in left shoulder: Secondary | ICD-10-CM | POA: Diagnosis not present

## 2020-10-24 DIAGNOSIS — M25511 Pain in right shoulder: Secondary | ICD-10-CM | POA: Diagnosis not present

## 2020-10-26 DIAGNOSIS — M25562 Pain in left knee: Secondary | ICD-10-CM | POA: Diagnosis not present

## 2020-10-26 DIAGNOSIS — M25662 Stiffness of left knee, not elsewhere classified: Secondary | ICD-10-CM | POA: Diagnosis not present

## 2020-11-08 DIAGNOSIS — J3081 Allergic rhinitis due to animal (cat) (dog) hair and dander: Secondary | ICD-10-CM | POA: Diagnosis not present

## 2020-11-08 DIAGNOSIS — J3089 Other allergic rhinitis: Secondary | ICD-10-CM | POA: Diagnosis not present

## 2020-11-08 DIAGNOSIS — J301 Allergic rhinitis due to pollen: Secondary | ICD-10-CM | POA: Diagnosis not present

## 2020-11-16 DIAGNOSIS — J3089 Other allergic rhinitis: Secondary | ICD-10-CM | POA: Diagnosis not present

## 2020-11-16 DIAGNOSIS — J3081 Allergic rhinitis due to animal (cat) (dog) hair and dander: Secondary | ICD-10-CM | POA: Diagnosis not present

## 2020-11-16 DIAGNOSIS — J301 Allergic rhinitis due to pollen: Secondary | ICD-10-CM | POA: Diagnosis not present

## 2020-11-28 DIAGNOSIS — J301 Allergic rhinitis due to pollen: Secondary | ICD-10-CM | POA: Diagnosis not present

## 2020-11-28 DIAGNOSIS — J3081 Allergic rhinitis due to animal (cat) (dog) hair and dander: Secondary | ICD-10-CM | POA: Diagnosis not present

## 2020-11-28 DIAGNOSIS — J3089 Other allergic rhinitis: Secondary | ICD-10-CM | POA: Diagnosis not present

## 2020-12-12 DIAGNOSIS — J3081 Allergic rhinitis due to animal (cat) (dog) hair and dander: Secondary | ICD-10-CM | POA: Diagnosis not present

## 2020-12-12 DIAGNOSIS — J3089 Other allergic rhinitis: Secondary | ICD-10-CM | POA: Diagnosis not present

## 2020-12-12 DIAGNOSIS — R059 Cough, unspecified: Secondary | ICD-10-CM | POA: Diagnosis not present

## 2020-12-12 DIAGNOSIS — L989 Disorder of the skin and subcutaneous tissue, unspecified: Secondary | ICD-10-CM | POA: Diagnosis not present

## 2020-12-21 DIAGNOSIS — J3081 Allergic rhinitis due to animal (cat) (dog) hair and dander: Secondary | ICD-10-CM | POA: Diagnosis not present

## 2020-12-21 DIAGNOSIS — J301 Allergic rhinitis due to pollen: Secondary | ICD-10-CM | POA: Diagnosis not present

## 2020-12-21 DIAGNOSIS — J3089 Other allergic rhinitis: Secondary | ICD-10-CM | POA: Diagnosis not present

## 2020-12-24 DIAGNOSIS — Z85828 Personal history of other malignant neoplasm of skin: Secondary | ICD-10-CM | POA: Diagnosis not present

## 2020-12-24 DIAGNOSIS — L570 Actinic keratosis: Secondary | ICD-10-CM | POA: Diagnosis not present

## 2020-12-24 DIAGNOSIS — L738 Other specified follicular disorders: Secondary | ICD-10-CM | POA: Diagnosis not present

## 2020-12-24 DIAGNOSIS — L578 Other skin changes due to chronic exposure to nonionizing radiation: Secondary | ICD-10-CM | POA: Diagnosis not present

## 2020-12-24 DIAGNOSIS — L821 Other seborrheic keratosis: Secondary | ICD-10-CM | POA: Diagnosis not present

## 2020-12-31 DIAGNOSIS — J301 Allergic rhinitis due to pollen: Secondary | ICD-10-CM | POA: Diagnosis not present

## 2020-12-31 DIAGNOSIS — J3089 Other allergic rhinitis: Secondary | ICD-10-CM | POA: Diagnosis not present

## 2020-12-31 DIAGNOSIS — J3081 Allergic rhinitis due to animal (cat) (dog) hair and dander: Secondary | ICD-10-CM | POA: Diagnosis not present

## 2021-01-02 DIAGNOSIS — M2392 Unspecified internal derangement of left knee: Secondary | ICD-10-CM | POA: Diagnosis not present

## 2021-01-02 DIAGNOSIS — M25562 Pain in left knee: Secondary | ICD-10-CM | POA: Diagnosis not present

## 2021-01-02 DIAGNOSIS — M25462 Effusion, left knee: Secondary | ICD-10-CM | POA: Diagnosis not present

## 2021-01-03 DIAGNOSIS — M2392 Unspecified internal derangement of left knee: Secondary | ICD-10-CM | POA: Diagnosis not present

## 2021-01-03 DIAGNOSIS — M1712 Unilateral primary osteoarthritis, left knee: Secondary | ICD-10-CM | POA: Diagnosis not present

## 2021-01-04 DIAGNOSIS — J3081 Allergic rhinitis due to animal (cat) (dog) hair and dander: Secondary | ICD-10-CM | POA: Diagnosis not present

## 2021-01-04 DIAGNOSIS — J301 Allergic rhinitis due to pollen: Secondary | ICD-10-CM | POA: Diagnosis not present

## 2021-01-04 DIAGNOSIS — J3089 Other allergic rhinitis: Secondary | ICD-10-CM | POA: Diagnosis not present

## 2021-01-16 DIAGNOSIS — J3089 Other allergic rhinitis: Secondary | ICD-10-CM | POA: Diagnosis not present

## 2021-01-16 DIAGNOSIS — J301 Allergic rhinitis due to pollen: Secondary | ICD-10-CM | POA: Diagnosis not present

## 2021-01-16 DIAGNOSIS — J3081 Allergic rhinitis due to animal (cat) (dog) hair and dander: Secondary | ICD-10-CM | POA: Diagnosis not present

## 2021-01-18 DIAGNOSIS — M25562 Pain in left knee: Secondary | ICD-10-CM | POA: Diagnosis not present

## 2021-01-21 DIAGNOSIS — J3081 Allergic rhinitis due to animal (cat) (dog) hair and dander: Secondary | ICD-10-CM | POA: Diagnosis not present

## 2021-01-21 DIAGNOSIS — J301 Allergic rhinitis due to pollen: Secondary | ICD-10-CM | POA: Diagnosis not present

## 2021-01-21 DIAGNOSIS — J3089 Other allergic rhinitis: Secondary | ICD-10-CM | POA: Diagnosis not present

## 2021-01-30 DIAGNOSIS — M1712 Unilateral primary osteoarthritis, left knee: Secondary | ICD-10-CM | POA: Diagnosis not present

## 2021-02-01 DIAGNOSIS — J3081 Allergic rhinitis due to animal (cat) (dog) hair and dander: Secondary | ICD-10-CM | POA: Diagnosis not present

## 2021-02-01 DIAGNOSIS — J301 Allergic rhinitis due to pollen: Secondary | ICD-10-CM | POA: Diagnosis not present

## 2021-02-01 DIAGNOSIS — J3089 Other allergic rhinitis: Secondary | ICD-10-CM | POA: Diagnosis not present

## 2021-03-05 IMAGING — MG DIGITAL DIAGNOSTIC UNILATERAL RIGHT MAMMOGRAM WITH TOMO AND CAD
6 series · 6 of 18 positions shown · non-contrast
Comparison: Previous exam(s).

CLINICAL DATA: Patient presents for palpable mass within the right
breast 1 o'clock position.

EXAM:
DIGITAL DIAGNOSTIC RIGHT MAMMOGRAM WITH CAD AND TOMO
ULTRASOUND RIGHT BREAST

[R TAN synth-2D]
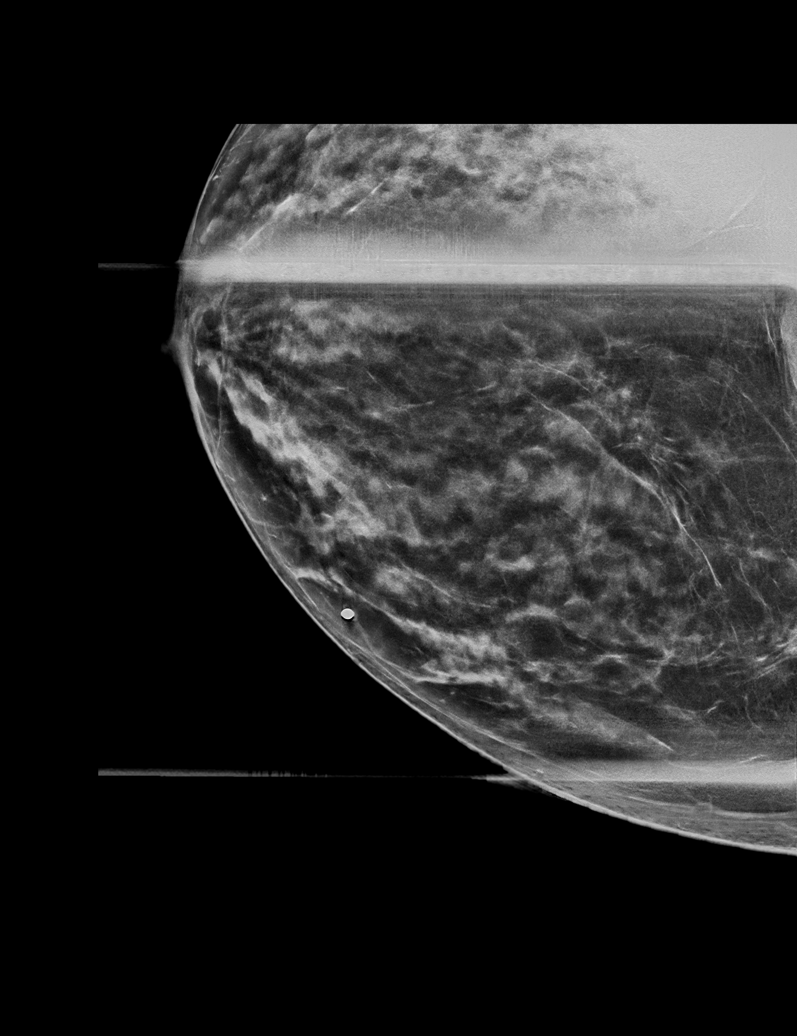

[R MLO synth-2D]
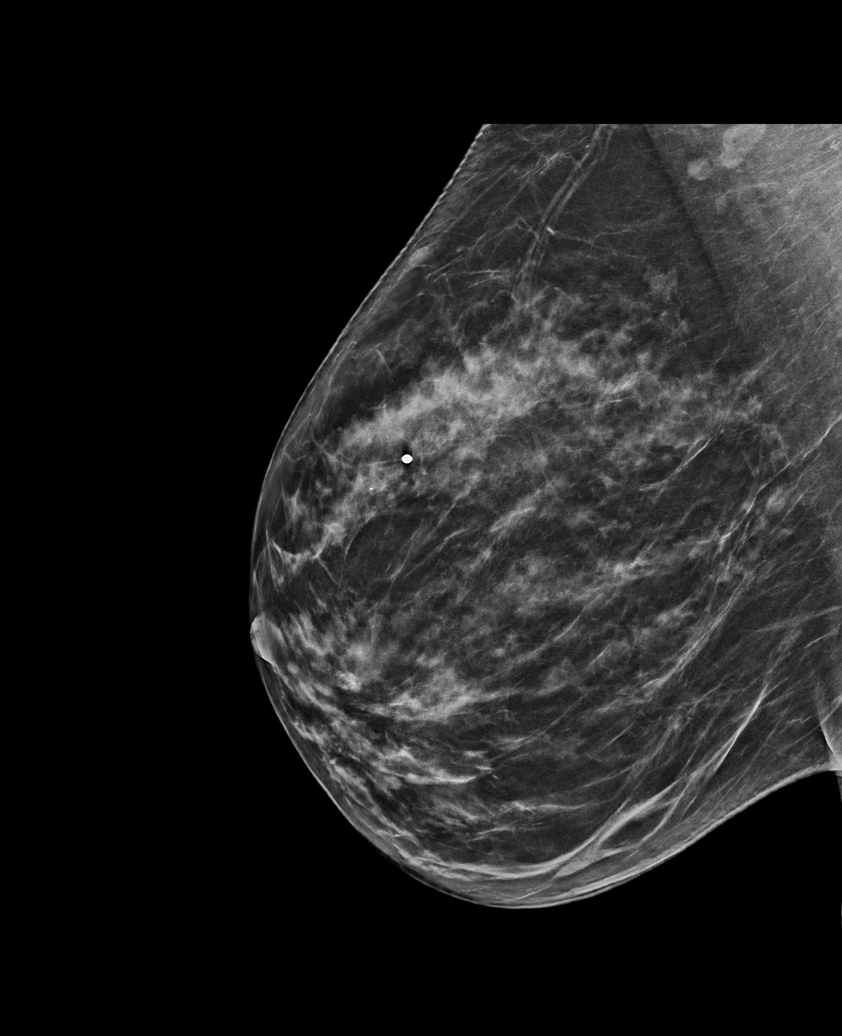

[R CC synth-2D]
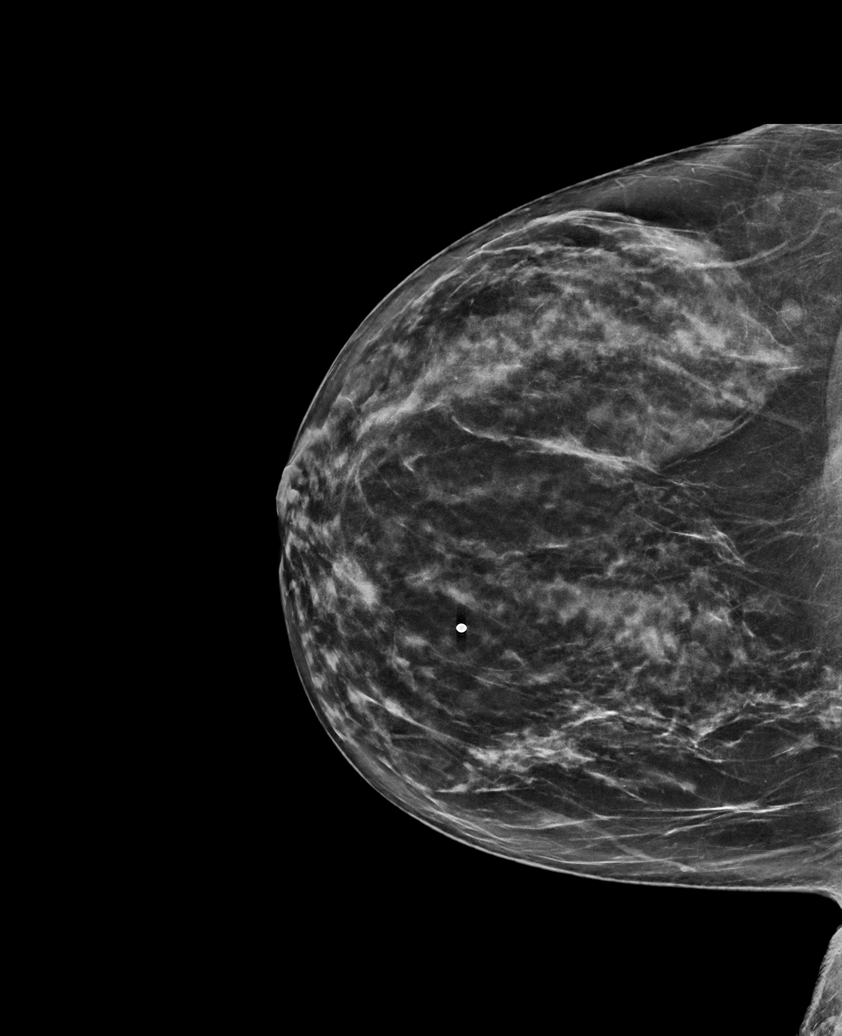

[R MLO tomo · tomo slice 36/71.0]
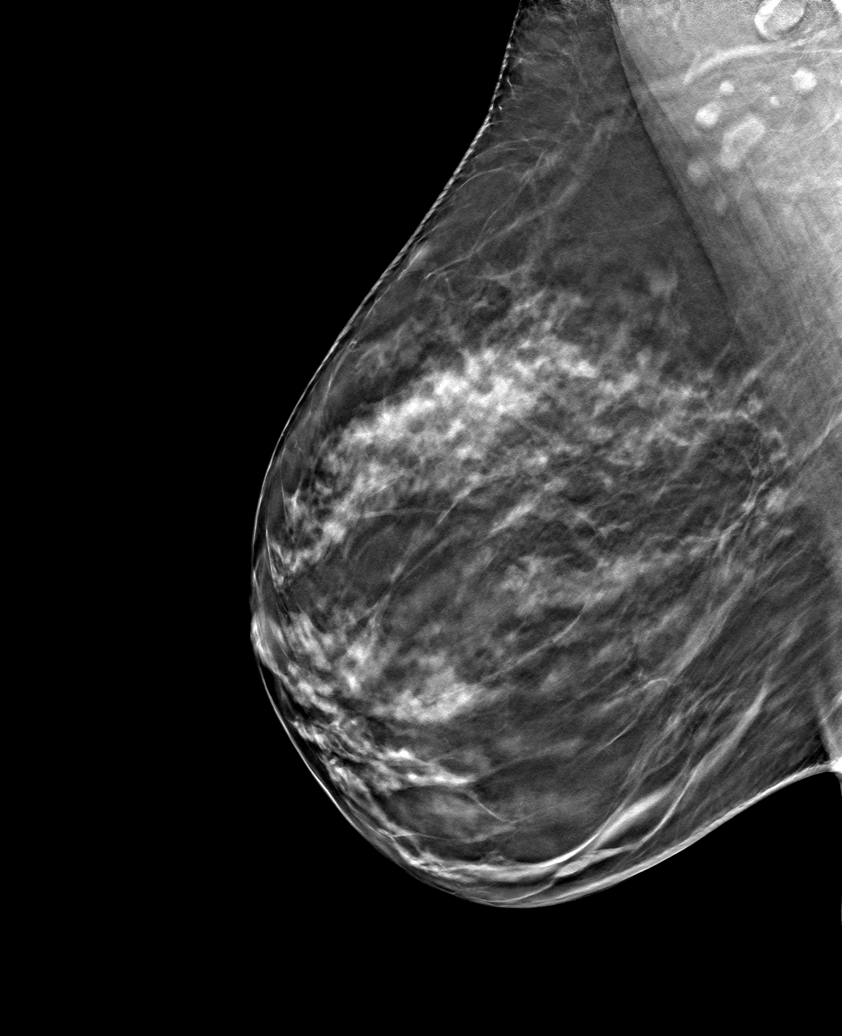

[R CC tomo · tomo slice 37/72.0]
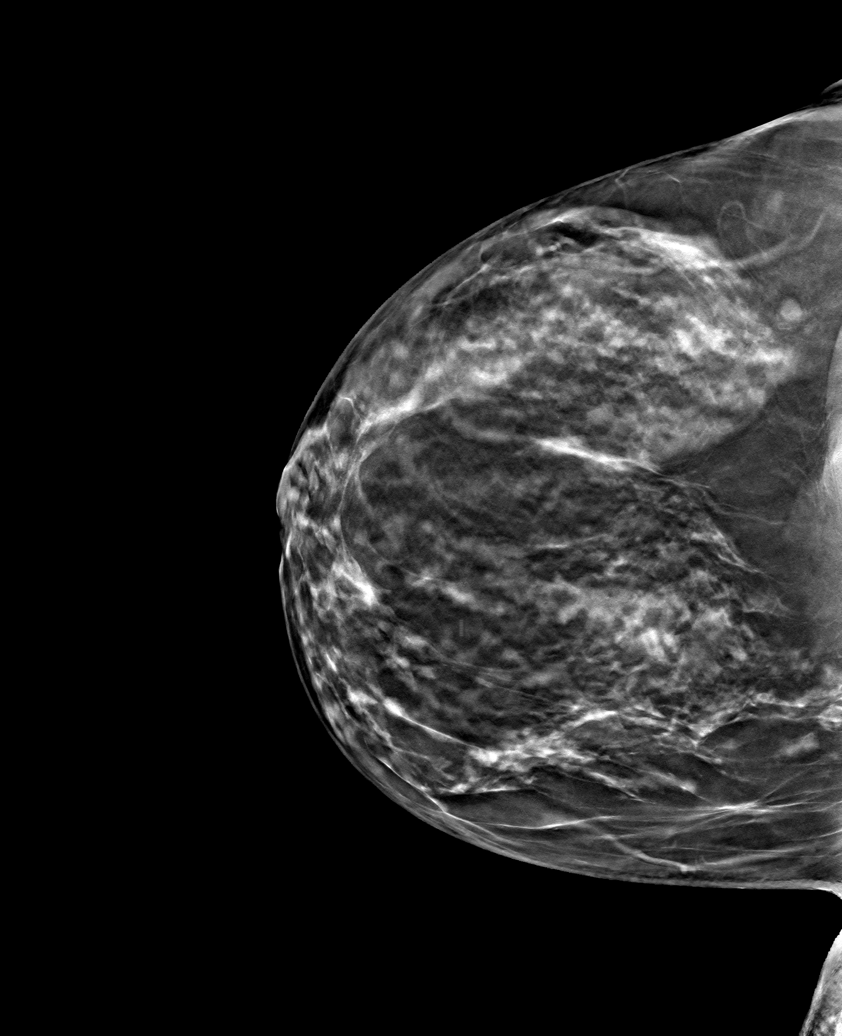

[R TAN tomo · tomo slice 36/71.0]
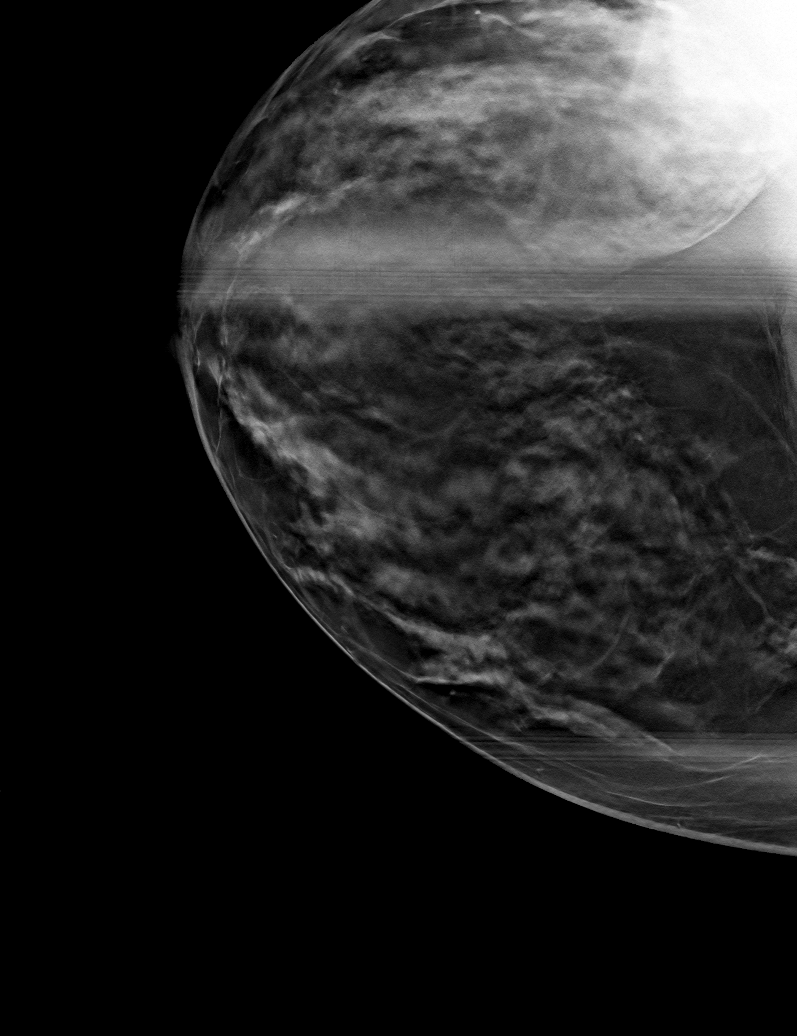

[6 of 18 positions shown; findings below may reference images not displayed]

ACR Breast Density Category c: The breast tissue is heterogeneously
dense, which may obscure small masses.
FINDINGS: No concerning masses, calcifications or distortion identified within
right breast.

Mammographic images were processed with CAD.

On physical exam, I palpate dense tissue without suspicious mass
within the medial right breast.

Targeted ultrasound is performed, showing dense tissue without
suspicious mass within the right breast 1 o'clock position the site
of palpable concern.
IMPRESSION: No mammographic evidence for malignancy.

RECOMMENDATION:
Screening mammogram in one year.(Code:AD-G-RT8).

Continued clinical evaluation right breast palpable abnormality.

I have discussed the findings and recommendations with the patient.
Results were also provided in writing at the conclusion of the
visit. If applicable, a reminder letter will be sent to the patient
regarding the next appointment.

BI-RADS CATEGORY  2: Benign.

## 2021-03-05 IMAGING — US ULTRASOUND RIGHT BREAST LIMITED
1 series · 2 of 2 positions shown · non-contrast
Comparison: Previous exam(s).

CLINICAL DATA: Patient presents for palpable mass within the right
breast 1 o'clock position.

EXAM:
DIGITAL DIAGNOSTIC RIGHT MAMMOGRAM WITH CAD AND TOMO
ULTRASOUND RIGHT BREAST

[Series 1: ultrasound right breast limited · 0.07mm/px · 2 of 2 slices shown]
[im 1/2]
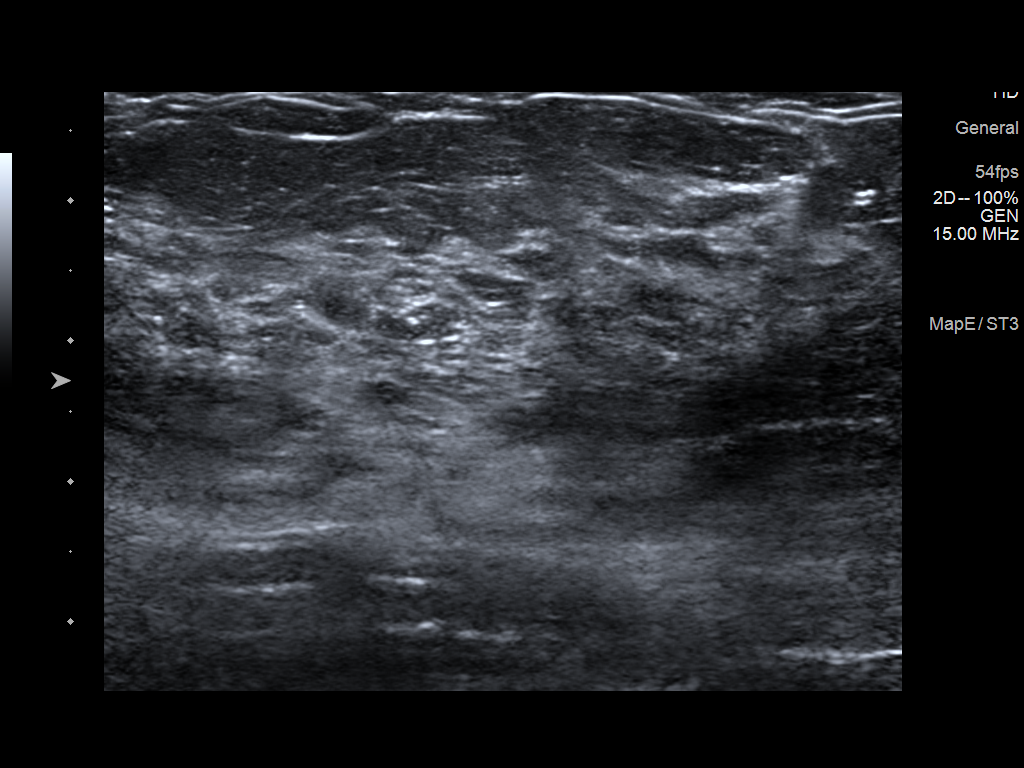
[im 2/2]
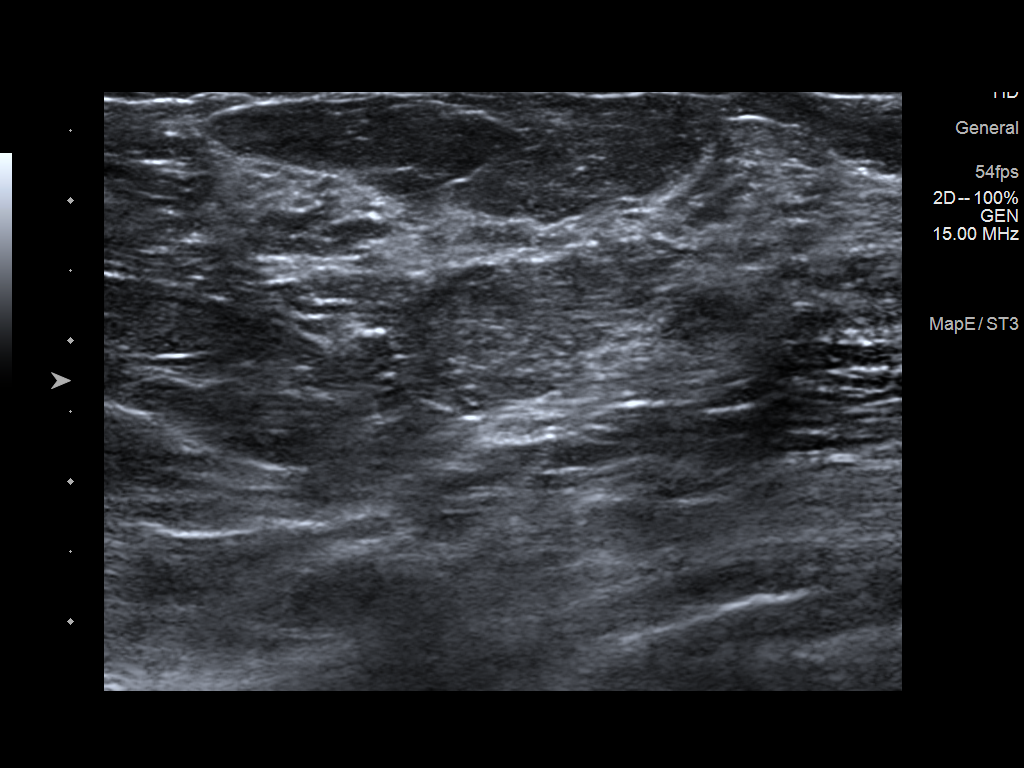

[2 of 2 positions shown; findings below may reference images not displayed]

ACR Breast Density Category c: The breast tissue is heterogeneously
dense, which may obscure small masses.
FINDINGS: No concerning masses, calcifications or distortion identified within
right breast.

Mammographic images were processed with CAD.

On physical exam, I palpate dense tissue without suspicious mass
within the medial right breast.

Targeted ultrasound is performed, showing dense tissue without
suspicious mass within the right breast 1 o'clock position the site
of palpable concern.
IMPRESSION: No mammographic evidence for malignancy.

RECOMMENDATION:
Screening mammogram in one year.(Code:AD-G-RT8).

Continued clinical evaluation right breast palpable abnormality.

I have discussed the findings and recommendations with the patient.
Results were also provided in writing at the conclusion of the
visit. If applicable, a reminder letter will be sent to the patient
regarding the next appointment.

BI-RADS CATEGORY  2: Benign.

## 2021-04-29 NOTE — Progress Notes (Addendum)
COVID swab appointment: 05/10/21 @ 12:00PM  COVID Vaccine Completed: yes x2 Date COVID Vaccine completed: Has received booster: COVID vaccine manufacturer: [employer]   Date of COVID positive in last 90 days: no  PCP - Benedetto Goad, MD Cardiologist - n/a  Chest x-ray - n/a EKG - n/a Stress Test - n/a ECHO - n/a Cardiac Cath - n/a Pacemaker/ICD device last checked: n/a Spinal Cord Stimulator: n/a  Sleep Study - n/a CPAP -   Fasting Blood Sugar - n/a Checks Blood Sugar _____ times a day  Blood Thinner Instructions: n/a Aspirin Instructions: Last Dose:  Activity level: Can go up a flight of stairs and perform activities of daily living without stopping and without symptoms of chest pain or shortness of breath.       Anesthesia review:   Patient denies shortness of breath, fever, cough and chest pain at PAT appointment   Patient verbalized understanding of instructions that were given to them at the PAT appointment. Patient was also instructed that they will need to review over the PAT instructions again at home before surgery.

## 2021-04-29 NOTE — Patient Instructions (Addendum)
DUE TO COVID-19 ONLY ONE VISITOR IS ALLOWED TO COME WITH YOU AND STAY IN THE WAITING ROOM ONLY DURING PRE OP AND PROCEDURE.   **NO VISITORS ARE ALLOWED IN THE SHORT STAY AREA OR RECOVERY ROOM!!**  IF YOU WILL BE ADMITTED INTO THE HOSPITAL YOU ARE ALLOWED ONLY TWO SUPPORT PEOPLE DURING VISITATION HOURS ONLY (7AM -8PM)   The support person(s) may change daily. The support person(s) must pass our screening, gel in and out, and wear a mask at all times, including in the patients room. Patients must also wear a mask when staff or their support person are in the room.  No visitors under the age of 66. Any visitor under the age of 72 must be accompanied by an adult.    COVID SWAB TESTING MUST BE COMPLETED ON:  05/10/21 @ 12:00PM    Come in thru [employer]. Have a seat in lobby directly to the right when you walk in. Call [phone], tell them your name and tell them you are here for a COVID test. You are not required to quarantine, however you are required to wear a well-fitted mask when you are out and around people not in your household.  Hand Hygiene often Do NOT share personal items Notify your provider if you are in close contact with someone who has COVID or you develop fever 100.4 or greater, new onset of sneezing, cough, sore throat, shortness of breath or body aches.       Your procedure is scheduled on: 05/14/21   Report to [facility] Main Entrance    Report to admitting at 5:15 AM   Call this number if you have problems the morning of surgery [phone]   Do not eat food :After Midnight.   May have liquids until 7:15 AM day of surgery  CLEAR LIQUID DIET  Foods Allowed                                                                     Foods Excluded  Water, Black Coffee and tea (no milk or creamer)           liquids that you cannot  Plain Jell-O in any flavor  (No red)                                    see through such as: Fruit ices (not with  fruit pulp)                                            milk, soups, orange juice              Iced Popsicles (No red)                                                All solid food  Apple juices Sports drinks like Gatorade (No red) Lightly seasoned clear broth or consume(fat free) Sugar     The day of surgery:  Drink ONE (1) Pre-Surgery Clear Ensure by 4:15 am the morning of surgery. Drink in one sitting. Do not sip.  This drink was given to you during your hospital  pre-op appointment visit. Nothing else to drink after completing the  Pre-Surgery Clear Ensure.          If you have questions, please contact your surgeons office.     Oral Hygiene is also important to reduce your risk of infection.                                    Remember - BRUSH YOUR TEETH THE MORNING OF SURGERY WITH YOUR REGULAR TOOTHPASTE   Take these medicines the morning of surgery with A SIP OF WATER: Cymbalta, Zetia, Synthroid, Cytomel                               You may not have any metal on your body including hair pins, jewelry, and body piercing             Do not wear make-up, lotions, powders, perfumes, or deodorant  Do not wear nail polish including gel and S&S, artificial/acrylic nails, or any other type of covering on natural nails including finger and toenails. If you have artificial nails, gel coating, etc. that needs to be removed by a nail salon please have this removed prior to surgery or surgery may need to be canceled/ delayed if the surgeon/ anesthesia feels like they are unable to be safely monitored.   Do not shave  48 hours prior to surgery.    Do not bring valuables to the hospital. [facility] IS NOT             RESPONSIBLE   FOR VALUABLES.   Bring small overnight bag day of surgery.              Please read over the following fact sheets you were given: IF YOU HAVE QUESTIONS ABOUT YOUR PRE-OP INSTRUCTIONS PLEASE CALL [phone]- [facility] - Preparing for Surgery Before surgery, you can play an important role.  Because skin is not sterile, your skin needs to be as free of germs as possible.  You can reduce the number of germs on your skin by washing with CHG (chlorahexidine gluconate) soap before surgery.  CHG is an antiseptic cleaner which kills germs and bonds with the skin to continue killing germs even after washing. Please DO NOT use if you have an allergy to CHG or antibacterial soaps.  If your skin becomes reddened/irritated stop using the CHG and inform your nurse when you arrive at Short Stay. Do not shave (including legs and underarms) for at least 48 hours prior to the first CHG shower.  You may shave your face/neck.  Please follow these instructions carefully:  1.  Shower with CHG Soap the night before surgery and the  morning of surgery.  2.  If you choose to wash your hair, wash your hair first as usual with your normal  shampoo.  3.  After you shampoo, rinse your hair and body thoroughly to remove the shampoo.  4.  Use CHG as you would any other liquid soap.  You can apply chg directly to the skin and wash.  Gently with a scrungie or clean washcloth.  5.  Apply the CHG Soap to your body ONLY FROM THE NECK DOWN.   Do   not use on face/ open                           Wound or open sores. Avoid contact with eyes, ears mouth and   genitals (private parts).                       Wash face,  Genitals (private parts) with your normal soap.             6.  Wash thoroughly, paying special attention to the area where your    surgery  will be performed.  7.  Thoroughly rinse your body with warm water from the neck down.  8.  DO NOT shower/wash with your normal soap after using and rinsing off the CHG Soap.                9.  Pat yourself dry with a clean towel.            10.  Wear clean pajamas.            11.  Place clean sheets on your bed the night of your first shower and do not  sleep with  pets. Day of Surgery : Do not apply any lotions/deodorants the morning of surgery.  Please wear clean clothes to the hospital/surgery center.  FAILURE TO FOLLOW THESE INSTRUCTIONS MAY RESULT IN THE CANCELLATION OF YOUR SURGERY  PATIENT SIGNATURE_________________________________  NURSE SIGNATURE__________________________________  ________________________________________________________________________   Savannah Martinez  An incentive spirometer is a tool that can help keep your lungs clear and active. This tool measures how well you are filling your lungs with each breath. Taking long deep breaths may help reverse or decrease the chance of developing breathing (pulmonary) problems (especially infection) following: A long period of time when you are unable to move or be active. BEFORE THE PROCEDURE  If the spirometer includes an indicator to show your best effort, your nurse or respiratory therapist will set it to a desired goal. If possible, sit up straight or lean slightly forward. Try not to slouch. Hold the incentive spirometer in an upright position. INSTRUCTIONS FOR USE  Sit on the edge of your bed if possible, or sit up as far as you can in bed or on a chair. Hold the incentive spirometer in an upright position. Breathe out normally. Place the mouthpiece in your mouth and seal your lips tightly around it. Breathe in slowly and as deeply as possible, raising the piston or the ball toward the top of the column. Hold your breath for 3-5 seconds or for as long as possible. Allow the piston or ball to fall to the bottom of the column. Remove the mouthpiece from your mouth and breathe out normally. Rest for a few seconds and repeat Steps 1 through 7 at least 10 times every 1-2 hours when you are awake. Take your time and take a few normal breaths between deep breaths. The spirometer may include an indicator to show your best effort. Use the indicator as a goal to work toward during  each repetition. After each set of 10 deep breaths, practice coughing to be sure  your lungs are clear. If you have an incision (the cut made at the time of surgery), support your incision when coughing by placing a pillow or rolled up towels firmly against it. Once you are able to get out of bed, walk around indoors and cough well. You may stop using the incentive spirometer when instructed by your caregiver.  RISKS AND COMPLICATIONS Take your time so you do not get dizzy or light-headed. If you are in pain, you may need to take or ask for pain medication before doing incentive spirometry. It is harder to take a deep breath if you are having pain. AFTER USE Rest and breathe slowly and easily. It can be helpful to keep track of a log of your progress. Your caregiver can provide you with a simple table to help with this. If you are using the spirometer at home, follow these instructions: SEEK MEDICAL CARE IF:  You are having difficultly using the spirometer. You have trouble using the spirometer as often as instructed. Your pain medication is not giving enough relief while using the spirometer. You develop fever of 100.5 F (38.1 C) or higher. SEEK IMMEDIATE MEDICAL CARE IF:  You cough up bloody sputum that had not been present before. You develop fever of 102 F (38.9 C) or greater. You develop worsening pain at or near the incision site. MAKE SURE YOU:  Understand these instructions. Will watch your condition. Will get help right away if you are not doing well or get worse. Document Released: 09/08/2006 Document Revised: 07/21/2011 Document Reviewed: 11/09/2006 [employer] Patient Information 2014 [employer], [place].   ________________________________________________________________________  WHAT IS A BLOOD TRANSFUSION? Blood Transfusion Information  A transfusion is the replacement of blood or some of its parts. Blood is made up of multiple cells which provide different functions. Red  blood cells carry oxygen and are used for blood loss replacement. White blood cells fight against infection. Platelets control bleeding. Plasma helps clot blood. Other blood products are available for specialized needs, such as hemophilia or other clotting disorders. BEFORE THE TRANSFUSION  Who gives blood for transfusions?  Healthy volunteers who are fully evaluated to make sure their blood is safe. This is blood bank blood. Transfusion therapy is the safest it has ever been in the practice of medicine. Before blood is taken from a donor, a complete history is taken to make sure that person has no history of diseases nor engages in risky social behavior (examples are intravenous drug use or sexual activity with multiple partners). The donor's travel history is screened to minimize risk of transmitting infections, such as malaria. The donated blood is tested for signs of infectious diseases, such as HIV and hepatitis. The blood is then tested to be sure it is compatible with you in order to minimize the chance of a transfusion reaction. If you or a relative donates blood, this is often done in anticipation of surgery and is not appropriate for emergency situations. It takes many days to process the donated blood. RISKS AND COMPLICATIONS Although transfusion therapy is very safe and saves many lives, the main dangers of transfusion include:  Getting an infectious disease. Developing a transfusion reaction. This is an allergic reaction to something in the blood you were given. Every precaution is taken to prevent this. The decision to have a blood transfusion has been considered carefully by your caregiver before blood is given. Blood is not given unless the benefits outweigh the risks. AFTER THE TRANSFUSION Right after receiving a blood transfusion,  you will usually feel much better and more energetic. This is especially true if your red blood cells have gotten low (anemic). The transfusion raises the  level of the red blood cells which carry oxygen, and this usually causes an energy increase. The nurse administering the transfusion will monitor you carefully for complications. HOME CARE INSTRUCTIONS  No special instructions are needed after a transfusion. You may find your energy is better. Speak with your caregiver about any limitations on activity for underlying diseases you may have. SEEK MEDICAL CARE IF:  Your condition is not improving after your transfusion. You develop redness or irritation at the intravenous (IV) site. SEEK IMMEDIATE MEDICAL CARE IF:  Any of the following symptoms occur over the next 12 hours: Shaking chills. You have a temperature by mouth above 102 F (38.9 C), not controlled by medicine. Chest, back, or muscle pain. People around you feel you are not acting correctly or are confused. Shortness of breath or difficulty breathing. Dizziness and fainting. You get a rash or develop hives. You have a decrease in urine output. Your urine turns a dark color or changes to pink, red, or brown. Any of the following symptoms occur over the next 10 days: You have a temperature by mouth above 102 F (38.9 C), not controlled by medicine. Shortness of breath. Weakness after normal activity. The white part of the eye turns yellow (jaundice). You have a decrease in the amount of urine or are urinating less often. Your urine turns a dark color or changes to pink, red, or brown. Document Released: 04/25/2000 Document Revised: 07/21/2011 Document Reviewed: 12/13/2007 [facility] Patient Information 2014 [place], [place].  _______________________________________________________________________

## 2021-05-01 ENCOUNTER — Encounter (INDEPENDENT_AMBULATORY_CARE_PROVIDER_SITE_OTHER): Payer: Self-pay

## 2021-05-01 ENCOUNTER — Encounter (HOSPITAL_COMMUNITY): Payer: Self-pay

## 2021-05-01 ENCOUNTER — Other Ambulatory Visit: Payer: Self-pay

## 2021-05-01 ENCOUNTER — Encounter (HOSPITAL_COMMUNITY)
Admission: RE | Admit: 2021-05-01 | Discharge: 2021-05-01 | Disposition: A | Discharge status: Home / Self Care | Payer: Medicare PPO | Source: Ambulatory Visit | Attending: Orthopedic Surgery | Admitting: Orthopedic Surgery

## 2021-05-01 VITALS — BP 135/90 | HR 98 | Temp 98.5°F | Resp 18 | Ht 64.0 in | Wt 157.0 lb

## 2021-05-01 DIAGNOSIS — M1712 Unilateral primary osteoarthritis, left knee: Secondary | ICD-10-CM | POA: Insufficient documentation

## 2021-05-01 DIAGNOSIS — Z01818 Encounter for other preprocedural examination: Secondary | ICD-10-CM

## 2021-05-01 DIAGNOSIS — Z01812 Encounter for preprocedural laboratory examination: Secondary | ICD-10-CM | POA: Insufficient documentation

## 2021-05-01 HISTORY — DX: Nausea with vomiting, unspecified: Z98.890

## 2021-05-01 HISTORY — DX: Unspecified osteoarthritis, unspecified site: M19.90

## 2021-05-01 HISTORY — DX: Other chronic pain: G89.29

## 2021-05-01 HISTORY — DX: Headache, unspecified: R51.9

## 2021-05-01 HISTORY — DX: Nausea with vomiting, unspecified: R11.2

## 2021-05-01 HISTORY — DX: Personal history of urinary calculi: Z87.442

## 2021-05-01 LAB — COMPREHENSIVE METABOLIC PANEL
ALT: 27 U/L (ref 0–44)
AST: 20 U/L (ref 15–41)
Albumin: 4.5 g/dL (ref 3.5–5.0)
Alkaline Phosphatase: 38 U/L (ref 38–126)
Anion gap: 7 (ref 5–15)
BUN: 13 mg/dL (ref 8–23)
CO2: 27 mmol/L (ref 22–32)
Calcium: 9.2 mg/dL (ref 8.9–10.3)
Chloride: 103 mmol/L (ref 98–111)
Creatinine, Ser: 0.79 mg/dL (ref 0.44–1.00)
GFR, Estimated: >60 mL/min (ref >60)
Glucose, Bld: 102 mg/dL — ABNORMAL HIGH (ref 70–99)
Potassium: 4.4 mmol/L (ref 3.5–5.1)
Sodium: 137 mmol/L (ref 135–145)
Total Bilirubin: 0.6 mg/dL (ref 0.3–1.2)
Total Protein: 7.6 g/dL (ref 6.5–8.1)

## 2021-05-01 LAB — TYPE AND SCREEN
ABO/RH(D): O POS
Antibody Screen: NEGATIVE

## 2021-05-01 LAB — CBC
HCT: 42.4 % (ref 36.0–46.0)
Hemoglobin: 14.5 g/dL (ref 12.0–15.0)
MCH: 31.7 pg (ref 26.0–34.0)
MCHC: 34.2 g/dL (ref 30.0–36.0)
MCV: 92.8 fL (ref 80.0–100.0)
Platelets: 250 10*3/uL (ref 150–400)
RBC: 4.57 MIL/uL (ref 3.87–5.11)
RDW: 12.7 % (ref 11.5–15.5)
WBC: 8.6 10*3/uL (ref 4.0–10.5)
nRBC: 0 % (ref 0.0–0.2)

## 2021-05-02 LAB — SURGICAL PCR SCREEN
MRSA, PCR: POSITIVE — AB
Staphylococcus aureus: POSITIVE — AB

## 2021-05-02 NOTE — Progress Notes (Signed)
STAPH and MRSA +. Resutls routed to Dr. Charlann Boxer

## 2021-05-10 ENCOUNTER — Other Ambulatory Visit: Payer: Self-pay

## 2021-05-10 ENCOUNTER — Encounter (HOSPITAL_COMMUNITY)
Admission: RE | Admit: 2021-05-10 | Discharge: 2021-05-10 | Disposition: A | Discharge status: Home / Self Care | Payer: Medicare PPO | Source: Ambulatory Visit | Attending: Orthopedic Surgery | Admitting: Orthopedic Surgery

## 2021-05-10 DIAGNOSIS — Z01818 Encounter for other preprocedural examination: Secondary | ICD-10-CM

## 2021-05-10 DIAGNOSIS — Z20822 Contact with and (suspected) exposure to covid-19: Secondary | ICD-10-CM | POA: Diagnosis not present

## 2021-05-10 DIAGNOSIS — Z01812 Encounter for preprocedural laboratory examination: Secondary | ICD-10-CM | POA: Diagnosis not present

## 2021-05-10 LAB — SARS CORONAVIRUS 2 (TAT 6-24 HRS): SARS Coronavirus 2: NEGATIVE

## 2021-05-13 ENCOUNTER — Encounter (HOSPITAL_COMMUNITY): Payer: Self-pay | Admitting: Orthopedic Surgery

## 2021-05-13 NOTE — H&P (Signed)
TOTAL KNEE ADMISSION H&P  Patient is being admitted for left total knee arthroplasty.  Subjective:  Chief Complaint:left knee pain.  HPI: Savannah Martinez, 66 y.o. female, has a history of pain and functional disability in the left knee due to arthritis and has failed non-surgical conservative treatments for greater than 12 weeks to includeNSAID's and/or analgesics, corticosteriod injections, and activity modification.  Onset of symptoms was gradual, starting 2 years ago with gradually worsening course since that time. The patient noted no past surgery on the left knee(s).  Patient currently rates pain in the left knee(s) at 8 out of 10 with activity. Patient has worsening of pain with activity and weight bearing and pain that interferes with activities of daily living.  Patient has evidence of joint space narrowing by imaging studies.  There is no active infection.  There are no problems to display for this patient.  Past Medical History:  Diagnosis Date   Arthritis    Chronic pain    Headache    History of kidney stones    PONV (postoperative nausea and vomiting)     Past Surgical History:  Procedure Laterality Date   PARTIAL KNEE ARTHROPLASTY Left     No current facility-administered medications for this encounter.   Current Outpatient Medications  Medication Sig Dispense Refill Last Dose   amphetamine-dextroamphetamine (ADDERALL) 30 MG tablet Take 30 mg by mouth daily.      CALCIUM PO Take 1 tablet by mouth daily.      cycloSPORINE (RESTASIS) 0.05 % ophthalmic emulsion Place 1 drop into both eyes 2 (two) times daily.      diazepam (VALIUM) 10 MG tablet Take 5 mg by mouth at bedtime as needed for sleep.      DULoxetine (CYMBALTA) 30 MG capsule Take 30 mg by mouth 2 (two) times a week.      estradiol (VIVELLE-DOT) 0.05 MG/24HR patch Place 1 patch onto the skin 2 (two) times a week.      ezetimibe (ZETIA) 10 MG tablet Take 10 mg by mouth 2 (two) times a week.       Glucosamine-Chondroit-Vit C-Mn (GLUCOSAMINE-CHONDROITIN) TABS Take 1 tablet by mouth daily.      hydroxypropyl methylcellulose / hypromellose (ISOPTO TEARS / GONIOVISC) 2.5 % ophthalmic solution Place 1 drop into both eyes 3 (three) times daily as needed for dry eyes.      levothyroxine (SYNTHROID) 50 MCG tablet Take 50 mcg by mouth daily before breakfast.      liothyronine (CYTOMEL) 5 MCG tablet Take 5 mcg by mouth daily.      Multiple Vitamin (MULTIVITAMIN WITH MINERALS) TABS tablet Take 1 tablet by mouth daily.      progesterone (PROMETRIUM) 100 MG capsule Take 100 mg by mouth at bedtime.      rizatriptan (MAXALT) 10 MG tablet Take 10 mg by mouth as needed for migraine. May repeat in 2 hours if needed      No Known Allergies  Social History   Tobacco Use   Smoking status: Never   Smokeless tobacco: Never  Substance Use Topics   Alcohol use: Yes    Comment: rare    History reviewed. No pertinent family history.   Review of Systems  Constitutional:  Negative for chills and fever.  Respiratory:  Negative for cough and shortness of breath.   Cardiovascular:  Negative for chest pain.  Gastrointestinal:  Negative for nausea and vomiting.  Musculoskeletal:  Positive for arthralgias.    Objective:  Physical Exam Well  nourished and well developed. General: Alert and oriented x3, cooperative and pleasant, no acute distress. Head: normocephalic, atraumatic, neck supple. Eyes: EOMI.  Musculoskeletal: Left knee exam: No palpable effusion, warmth or erythema Tenderness laterally and medially No significant flexion contracture Crepitation noted with flexion over 120 degrees Stable medial lateral collateral ligaments  Calves soft and nontender. Motor function intact in LE. Strength 5/5 LE bilaterally. Neuro: Distal pulses 2+. Sensation to light touch intact in LE.  Vital signs in last 24 hours:    Labs:   Estimated body mass index is 26.95 kg/m as calculated from the  following:   Height as of 05/01/21: 5\' 4"  (1.626 m).   Weight as of 05/01/21: 71.2 kg.   Imaging Review Plain radiographs demonstrate severe degenerative joint disease of the left knee(s). The overall alignment isneutral. The bone quality appears to be adequate for age and reported activity level.      Assessment/Plan:  End stage arthritis, left knee   The patient history, physical examination, clinical judgment of the provider and imaging studies are consistent with end stage degenerative joint disease of the left knee(s) and total knee arthroplasty is deemed medically necessary. The treatment options including medical management, injection therapy arthroscopy and arthroplasty were discussed at length. The risks and benefits of total knee arthroplasty were presented and reviewed. The risks due to aseptic loosening, infection, stiffness, patella tracking problems, thromboembolic complications and other imponderables were discussed. The patient acknowledged the explanation, agreed to proceed with the plan and consent was signed. Patient is being admitted for inpatient treatment for surgery, pain control, PT, OT, prophylactic antibiotics, VTE prophylaxis, progressive ambulation and ADL's and discharge planning. The patient is planning to be discharged  home.  Therapy Plans: outpatient therapy at [facility] Disposition: Home with daughter Planned DVT Prophylaxis: aspirin 81mg  BID DME needed: walker PCP: Dr. Kathryne Eriksson, clearance received TXA: IV Allergies: NKDA Anesthesia Concerns: spinal * BMI: 27.4 Last HgbA1c: Not diabetic  Other: - Oxycodone, robaxin, tylenol, toradol **pain control concerns - Discussed SDD vs overnight stay - planning on overnight - *   Patient's anticipated LOS is less than 2 midnights, meeting these requirements: - Younger than 65 - Lives within 1 hour of care - Has a competent adult at home to recover with post-op recover - NO history of  - Chronic  pain requiring opiods  - Diabetes  - Coronary Artery Disease  - Heart failure  - Heart attack  - Stroke  - DVT/VTE  - Cardiac arrhythmia  - Respiratory Failure/COPD  - Renal failure  - Anemia  - Advanced Liver disease  Costella Hatcher, PA-C Orthopedic Surgery [facility] [place] [phone]

## 2021-05-14 ENCOUNTER — Observation Stay (HOSPITAL_COMMUNITY)
Admission: RE | Admit: 2021-05-14 | Discharge: 2021-05-15 | Disposition: A | Discharge status: Home / Self Care | Payer: Medicare PPO | Attending: Orthopedic Surgery | Admitting: Orthopedic Surgery

## 2021-05-14 ENCOUNTER — Ambulatory Visit (HOSPITAL_COMMUNITY): Payer: Medicare PPO | Admitting: Anesthesiology

## 2021-05-14 ENCOUNTER — Encounter (HOSPITAL_COMMUNITY)
Admission: RE | Disposition: A | Discharge status: Home / Self Care | Payer: Self-pay | Source: Home / Self Care | Attending: Orthopedic Surgery

## 2021-05-14 ENCOUNTER — Encounter (HOSPITAL_COMMUNITY): Payer: Self-pay | Admitting: Orthopedic Surgery

## 2021-05-14 ENCOUNTER — Other Ambulatory Visit: Payer: Self-pay

## 2021-05-14 DIAGNOSIS — Z96652 Presence of left artificial knee joint: Secondary | ICD-10-CM

## 2021-05-14 DIAGNOSIS — Z79899 Other long term (current) drug therapy: Secondary | ICD-10-CM | POA: Insufficient documentation

## 2021-05-14 DIAGNOSIS — M1712 Unilateral primary osteoarthritis, left knee: Principal | ICD-10-CM | POA: Insufficient documentation

## 2021-05-14 HISTORY — PX: TOTAL KNEE ARTHROPLASTY: SHX125

## 2021-05-14 LAB — ABO/RH: ABO/RH(D): O POS

## 2021-05-14 SURGERY — ARTHROPLASTY, KNEE, TOTAL
Anesthesia: Spinal | Site: Knee | Laterality: Left

## 2021-05-14 MED ORDER — ACETAMINOPHEN 325 MG PO TABS: 325.0000 mg | ORAL_TABLET | Freq: Four times a day (QID) | ORAL | Status: DC | PRN

## 2021-05-14 MED ORDER — 0.9 % SODIUM CHLORIDE (POUR BTL) OPTIME
TOPICAL | Status: DC | PRN
Start: 2021-05-14 — End: 2021-05-14
  Administered 2021-05-14: 1000 mL

## 2021-05-14 MED ORDER — PROPOFOL 1000 MG/100ML IV EMUL
INTRAVENOUS | Status: AC
  Filled 2021-05-14: qty 100

## 2021-05-14 MED ORDER — ONDANSETRON HCL 4 MG PO TABS: 4.0000 mg | ORAL_TABLET | Freq: Four times a day (QID) | ORAL | Status: DC | PRN

## 2021-05-14 MED ORDER — SUMATRIPTAN SUCCINATE 50 MG PO TABS
50.0000 mg | ORAL_TABLET | Freq: Once | ORAL | Status: DC | PRN
  Filled 2021-05-14: qty 1

## 2021-05-14 MED ORDER — FENTANYL CITRATE (PF) 100 MCG/2ML IJ SOLN
INTRAMUSCULAR | Status: DC | PRN
  Administered 2021-05-14: 25 ug via INTRAVENOUS
  Administered 2021-05-14: 50 ug via INTRAVENOUS

## 2021-05-14 MED ORDER — CELECOXIB 200 MG PO CAPS
200.0000 mg | ORAL_CAPSULE | Freq: Two times a day (BID) | ORAL | Status: DC
  Administered 2021-05-14 – 2021-05-15 (×3): 200 mg via ORAL
  Filled 2021-05-14 (×3): qty 1

## 2021-05-14 MED ORDER — DEXAMETHASONE SODIUM PHOSPHATE 10 MG/ML IJ SOLN
INTRAMUSCULAR | Status: DC | PRN
  Administered 2021-05-14: 8 mg via INTRAVENOUS

## 2021-05-14 MED ORDER — LEVOTHYROXINE SODIUM 50 MCG PO TABS
50.0000 ug | ORAL_TABLET | Freq: Every day | ORAL | Status: DC
  Administered 2021-05-15: 50 ug via ORAL
  Filled 2021-05-14: qty 1

## 2021-05-14 MED ORDER — DEXAMETHASONE SODIUM PHOSPHATE 10 MG/ML IJ SOLN: 8.0000 mg | Freq: Once | INTRAMUSCULAR | Status: DC

## 2021-05-14 MED ORDER — HYDROMORPHONE HCL 1 MG/ML IJ SOLN: 0.5000 mg | INTRAMUSCULAR | Status: DC | PRN

## 2021-05-14 MED ORDER — ONDANSETRON HCL 4 MG/2ML IJ SOLN
INTRAMUSCULAR | Status: AC
  Filled 2021-05-14: qty 4

## 2021-05-14 MED ORDER — METHOCARBAMOL 500 MG PO TABS
500.0000 mg | ORAL_TABLET | Freq: Four times a day (QID) | ORAL | Status: DC | PRN
  Administered 2021-05-14 – 2021-05-15 (×3): 500 mg via ORAL
  Filled 2021-05-14 (×3): qty 1

## 2021-05-14 MED ORDER — LIDOCAINE 2% (20 MG/ML) 5 ML SYRINGE
INTRAMUSCULAR | Status: DC | PRN
  Administered 2021-05-14: 100 mg via INTRAVENOUS

## 2021-05-14 MED ORDER — METOCLOPRAMIDE HCL 5 MG PO TABS: 5.0000 mg | ORAL_TABLET | Freq: Three times a day (TID) | ORAL | Status: DC | PRN

## 2021-05-14 MED ORDER — OXYCODONE HCL 5 MG/5ML PO SOLN: 5.0000 mg | Freq: Once | ORAL | Status: AC | PRN

## 2021-05-14 MED ORDER — DOCUSATE SODIUM 100 MG PO CAPS
100.0000 mg | ORAL_CAPSULE | Freq: Two times a day (BID) | ORAL | Status: DC
  Administered 2021-05-14 – 2021-05-15 (×3): 100 mg via ORAL
  Filled 2021-05-14 (×3): qty 1

## 2021-05-14 MED ORDER — PROPOFOL 10 MG/ML IV BOLUS
INTRAVENOUS | Status: DC | PRN
Start: 2021-05-14 — End: 2021-05-14
  Administered 2021-05-14: 30 mg via INTRAVENOUS

## 2021-05-14 MED ORDER — TRANEXAMIC ACID-NACL 1000-0.7 MG/100ML-% IV SOLN
1000.0000 mg | Freq: Once | INTRAVENOUS | Status: AC
  Administered 2021-05-14: 1000 mg via INTRAVENOUS
  Filled 2021-05-14: qty 100

## 2021-05-14 MED ORDER — TRANEXAMIC ACID-NACL 1000-0.7 MG/100ML-% IV SOLN
1000.0000 mg | INTRAVENOUS | Status: AC
  Administered 2021-05-14: 1000 mg via INTRAVENOUS
  Filled 2021-05-14: qty 100

## 2021-05-14 MED ORDER — AMPHETAMINE-DEXTROAMPHETAMINE 10 MG PO TABS: 30.0000 mg | ORAL_TABLET | Freq: Every day | ORAL | Status: DC

## 2021-05-14 MED ORDER — AMPHETAMINE-DEXTROAMPHETAMINE 5 MG PO TABS
15.0000 mg | ORAL_TABLET | Freq: Every day | ORAL | Status: DC
  Filled 2021-05-14: qty 1

## 2021-05-14 MED ORDER — MIDAZOLAM HCL 2 MG/2ML IJ SOLN
INTRAMUSCULAR | Status: AC
  Filled 2021-05-14: qty 2

## 2021-05-14 MED ORDER — KETOROLAC TROMETHAMINE 15 MG/ML IJ SOLN
7.5000 mg | Freq: Four times a day (QID) | INTRAMUSCULAR | Status: AC
  Administered 2021-05-14 – 2021-05-15 (×4): 7.5 mg via INTRAVENOUS
  Filled 2021-05-14 (×4): qty 1

## 2021-05-14 MED ORDER — PROPOFOL 10 MG/ML IV BOLUS
INTRAVENOUS | Status: AC
  Filled 2021-05-14: qty 20

## 2021-05-14 MED ORDER — PROPOFOL 1000 MG/100ML IV EMUL
INTRAVENOUS | Status: AC
  Filled 2021-05-14: qty 200

## 2021-05-14 MED ORDER — LACTATED RINGERS IV SOLN: INTRAVENOUS | Status: DC

## 2021-05-14 MED ORDER — ONDANSETRON HCL 4 MG/2ML IJ SOLN: 4.0000 mg | Freq: Once | INTRAMUSCULAR | Status: DC | PRN

## 2021-05-14 MED ORDER — CEFAZOLIN SODIUM-DEXTROSE 2-4 GM/100ML-% IV SOLN
2.0000 g | INTRAVENOUS | Status: AC
  Administered 2021-05-14: 2 g via INTRAVENOUS
  Filled 2021-05-14: qty 100

## 2021-05-14 MED ORDER — METHOCARBAMOL 1000 MG/10ML IJ SOLN
500.0000 mg | Freq: Four times a day (QID) | INTRAVENOUS | Status: DC | PRN
  Filled 2021-05-14: qty 5

## 2021-05-14 MED ORDER — HYDROMORPHONE HCL 1 MG/ML IJ SOLN
0.2500 mg | INTRAMUSCULAR | Status: DC | PRN
  Administered 2021-05-14: 0.5 mg via INTRAVENOUS

## 2021-05-14 MED ORDER — CYCLOSPORINE 0.05 % OP EMUL
1.0000 [drp] | Freq: Two times a day (BID) | OPHTHALMIC | Status: DC
  Administered 2021-05-14 – 2021-05-15 (×2): 1 [drp] via OPHTHALMIC
  Filled 2021-05-14 (×2): qty 30

## 2021-05-14 MED ORDER — ACETAMINOPHEN 10 MG/ML IV SOLN
1000.0000 mg | Freq: Once | INTRAVENOUS | Status: DC | PRN
  Administered 2021-05-14: 1000 mg via INTRAVENOUS

## 2021-05-14 MED ORDER — PROGESTERONE MICRONIZED 100 MG PO CAPS: 100.0000 mg | ORAL_CAPSULE | Freq: Every day | ORAL | Status: DC

## 2021-05-14 MED ORDER — ORAL CARE MOUTH RINSE: 15.0000 mL | Freq: Once | OROMUCOSAL | Status: AC

## 2021-05-14 MED ORDER — OXYCODONE HCL 5 MG PO TABS
5.0000 mg | ORAL_TABLET | Freq: Once | ORAL | Status: AC | PRN
  Administered 2021-05-14: 5 mg via ORAL

## 2021-05-14 MED ORDER — ONDANSETRON HCL 4 MG/2ML IJ SOLN
INTRAMUSCULAR | Status: DC | PRN
  Administered 2021-05-14: 4 mg via INTRAVENOUS

## 2021-05-14 MED ORDER — POVIDONE-IODINE 10 % EX SWAB
2.0000 | Freq: Once | CUTANEOUS | Status: AC
Start: 2021-05-14 — End: 2021-05-14
  Administered 2021-05-14: 2 via TOPICAL

## 2021-05-14 MED ORDER — VANCOMYCIN HCL IN DEXTROSE 1-5 GM/200ML-% IV SOLN
1000.0000 mg | Freq: Once | INTRAVENOUS | Status: AC
  Administered 2021-05-14: 1000 mg via INTRAVENOUS
  Filled 2021-05-14: qty 200

## 2021-05-14 MED ORDER — CEFAZOLIN SODIUM-DEXTROSE 2-4 GM/100ML-% IV SOLN
2.0000 g | Freq: Four times a day (QID) | INTRAVENOUS | Status: AC
  Administered 2021-05-14 (×2): 2 g via INTRAVENOUS
  Filled 2021-05-14 (×2): qty 100

## 2021-05-14 MED ORDER — PROPOFOL 500 MG/50ML IV EMUL
INTRAVENOUS | Status: DC | PRN
  Administered 2021-05-14: 85 ug/kg/min via INTRAVENOUS

## 2021-05-14 MED ORDER — EZETIMIBE 10 MG PO TABS: 10.0000 mg | ORAL_TABLET | ORAL | Status: DC

## 2021-05-14 MED ORDER — ROPIVACAINE HCL 5 MG/ML IJ SOLN
INTRAMUSCULAR | Status: DC | PRN
Start: 2021-05-14 — End: 2021-05-14
  Administered 2021-05-14: 20 mL via PERINEURAL

## 2021-05-14 MED ORDER — SODIUM CHLORIDE (PF) 0.9 % IJ SOLN
INTRAMUSCULAR | Status: DC | PRN
  Administered 2021-05-14: 30 mL

## 2021-05-14 MED ORDER — FENTANYL CITRATE (PF) 100 MCG/2ML IJ SOLN
INTRAMUSCULAR | Status: AC
  Filled 2021-05-14: qty 2

## 2021-05-14 MED ORDER — FERROUS SULFATE 325 (65 FE) MG PO TABS
325.0000 mg | ORAL_TABLET | Freq: Three times a day (TID) | ORAL | Status: DC
  Administered 2021-05-14 – 2021-05-15 (×3): 325 mg via ORAL
  Filled 2021-05-14 (×3): qty 1

## 2021-05-14 MED ORDER — SODIUM CHLORIDE 0.9 % IR SOLN
Status: DC | PRN
  Administered 2021-05-14: 1000 mL

## 2021-05-14 MED ORDER — DEXAMETHASONE SODIUM PHOSPHATE 10 MG/ML IJ SOLN
10.0000 mg | Freq: Once | INTRAMUSCULAR | Status: AC
  Administered 2021-05-15: 10 mg via INTRAVENOUS
  Filled 2021-05-14: qty 1

## 2021-05-14 MED ORDER — HYDROMORPHONE HCL 1 MG/ML IJ SOLN
INTRAMUSCULAR | Status: AC
  Filled 2021-05-14: qty 1

## 2021-05-14 MED ORDER — LIDOCAINE HCL (PF) 2 % IJ SOLN
INTRAMUSCULAR | Status: AC
  Filled 2021-05-14: qty 10

## 2021-05-14 MED ORDER — DIPHENHYDRAMINE HCL 12.5 MG/5ML PO ELIX: 12.5000 mg | ORAL_SOLUTION | ORAL | Status: DC | PRN

## 2021-05-14 MED ORDER — POLYETHYLENE GLYCOL 3350 17 G PO PACK
17.0000 g | PACK | Freq: Every day | ORAL | Status: DC | PRN
  Administered 2021-05-14: 17 g via ORAL
  Filled 2021-05-14: qty 1

## 2021-05-14 MED ORDER — ONDANSETRON HCL 4 MG/2ML IJ SOLN: 4.0000 mg | Freq: Four times a day (QID) | INTRAMUSCULAR | Status: DC | PRN

## 2021-05-14 MED ORDER — DEXAMETHASONE SODIUM PHOSPHATE 10 MG/ML IJ SOLN
INTRAMUSCULAR | Status: AC
  Filled 2021-05-14: qty 2

## 2021-05-14 MED ORDER — ASPIRIN 81 MG PO CHEW
81.0000 mg | CHEWABLE_TABLET | Freq: Two times a day (BID) | ORAL | Status: DC
  Administered 2021-05-14 – 2021-05-15 (×2): 81 mg via ORAL
  Filled 2021-05-14 (×2): qty 1

## 2021-05-14 MED ORDER — BUPIVACAINE-EPINEPHRINE (PF) 0.25% -1:200000 IJ SOLN
INTRAMUSCULAR | Status: AC
  Filled 2021-05-14: qty 30

## 2021-05-14 MED ORDER — OXYCODONE HCL 5 MG PO TABS
ORAL_TABLET | ORAL | Status: AC
  Filled 2021-05-14: qty 1

## 2021-05-14 MED ORDER — ACETAMINOPHEN 10 MG/ML IV SOLN
INTRAVENOUS | Status: AC
  Filled 2021-05-14: qty 100

## 2021-05-14 MED ORDER — BUPIVACAINE IN DEXTROSE 0.75-8.25 % IT SOLN
INTRATHECAL | Status: DC | PRN
  Administered 2021-05-14: 1.4 mL via INTRATHECAL

## 2021-05-14 MED ORDER — OXYCODONE HCL 5 MG PO TABS
10.0000 mg | ORAL_TABLET | ORAL | Status: DC | PRN
  Administered 2021-05-15: 15 mg via ORAL
  Administered 2021-05-15: 10 mg via ORAL
  Filled 2021-05-14 (×2): qty 3

## 2021-05-14 MED ORDER — LIOTHYRONINE SODIUM 5 MCG PO TABS
5.0000 ug | ORAL_TABLET | Freq: Every day | ORAL | Status: DC
  Administered 2021-05-15: 5 ug via ORAL
  Filled 2021-05-14: qty 1

## 2021-05-14 MED ORDER — KETOROLAC TROMETHAMINE 30 MG/ML IJ SOLN
INTRAMUSCULAR | Status: DC | PRN
  Administered 2021-05-14: 30 mg via INTRA_ARTICULAR

## 2021-05-14 MED ORDER — BUPIVACAINE-EPINEPHRINE (PF) 0.25% -1:200000 IJ SOLN
INTRAMUSCULAR | Status: DC | PRN
  Administered 2021-05-14: 30 mL

## 2021-05-14 MED ORDER — POLYVINYL ALCOHOL 1.4 % OP SOLN
1.0000 [drp] | Freq: Three times a day (TID) | OPHTHALMIC | Status: DC | PRN
  Filled 2021-05-14: qty 15

## 2021-05-14 MED ORDER — DULOXETINE HCL 30 MG PO CPEP
30.0000 mg | ORAL_CAPSULE | Freq: Every day | ORAL | Status: DC
  Administered 2021-05-15: 30 mg via ORAL
  Filled 2021-05-14: qty 1

## 2021-05-14 MED ORDER — METOCLOPRAMIDE HCL 5 MG/ML IJ SOLN: 5.0000 mg | Freq: Three times a day (TID) | INTRAMUSCULAR | Status: DC | PRN

## 2021-05-14 MED ORDER — PHENOL 1.4 % MT LIQD: 1.0000 | OROMUCOSAL | Status: DC | PRN

## 2021-05-14 MED ORDER — OXYCODONE HCL 5 MG PO TABS
5.0000 mg | ORAL_TABLET | ORAL | Status: DC | PRN
  Administered 2021-05-14 – 2021-05-15 (×3): 10 mg via ORAL
  Filled 2021-05-14: qty 1
  Filled 2021-05-14 (×3): qty 2

## 2021-05-14 MED ORDER — SODIUM CHLORIDE 0.9 % IV SOLN: INTRAVENOUS | Status: DC

## 2021-05-14 MED ORDER — MENTHOL 3 MG MT LOZG: 1.0000 | LOZENGE | OROMUCOSAL | Status: DC | PRN

## 2021-05-14 MED ORDER — BISACODYL 10 MG RE SUPP: 10.0000 mg | Freq: Every day | RECTAL | Status: DC | PRN

## 2021-05-14 MED ORDER — CHLORHEXIDINE GLUCONATE 0.12 % MT SOLN
15.0000 mL | Freq: Once | OROMUCOSAL | Status: AC
  Administered 2021-05-14: 15 mL via OROMUCOSAL

## 2021-05-14 MED ORDER — KETOROLAC TROMETHAMINE 30 MG/ML IJ SOLN
INTRAMUSCULAR | Status: AC
  Filled 2021-05-14: qty 1

## 2021-05-14 MED ORDER — DIAZEPAM 5 MG PO TABS: 5.0000 mg | ORAL_TABLET | Freq: Every evening | ORAL | Status: DC | PRN

## 2021-05-14 MED ORDER — MIDAZOLAM HCL 5 MG/5ML IJ SOLN
INTRAMUSCULAR | Status: DC | PRN
  Administered 2021-05-14: 2 mg via INTRAVENOUS

## 2021-05-14 SURGICAL SUPPLY — 57 items
ADH SKN CLS APL DERMABOND .7 (GAUZE/BANDAGES/DRESSINGS) ×1
ATTUNE MED ANAT PAT 35 KNEE (Knees) ×1 IMPLANT
ATTUNE PSFEM LTSZ4 NARCEM KNEE (Femur) ×1 IMPLANT
ATTUNE PSRP INSR SZ4 8 KNEE (Insert) ×1 IMPLANT
BAG COUNTER SPONGE SURGICOUNT (BAG) IMPLANT
BAG SPEC THK2 15X12 ZIP CLS (MISCELLANEOUS)
BAG SPNG CNTER NS LX DISP (BAG)
BAG ZIPLOCK 12X15 (MISCELLANEOUS) IMPLANT
BASEPLATE TIBIAL ROTATING SZ 4 (Knees) ×1 IMPLANT
BLADE SAW SGTL 11.0X1.19X90.0M (BLADE) IMPLANT
BLADE SAW SGTL 13.0X1.19X90.0M (BLADE) ×2 IMPLANT
BLADE SURG SZ10 CARB STEEL (BLADE) ×4 IMPLANT
BNDG CMPR MED 10X6 ELC LF (GAUZE/BANDAGES/DRESSINGS) ×1
BNDG ELASTIC 6X10 VLCR STRL LF (GAUZE/BANDAGES/DRESSINGS) ×1 IMPLANT
BNDG ELASTIC 6X5.8 VLCR STR LF (GAUZE/BANDAGES/DRESSINGS) ×2 IMPLANT
BOWL SMART MIX CTS (DISPOSABLE) ×2 IMPLANT
BSPLAT TIB 4 CMNT ROT PLAT STR (Knees) ×1 IMPLANT
CEMENT HV SMART SET (Cement) ×2 IMPLANT
CUFF TOURN SGL QUICK 34 (TOURNIQUET CUFF) ×2
CUFF TRNQT CYL 34X4.125X (TOURNIQUET CUFF) ×1 IMPLANT
DECANTER SPIKE VIAL GLASS SM (MISCELLANEOUS) ×4 IMPLANT
DERMABOND ADVANCED (GAUZE/BANDAGES/DRESSINGS) ×1
DERMABOND ADVANCED .7 DNX12 (GAUZE/BANDAGES/DRESSINGS) ×1 IMPLANT
DRAPE INCISE IOBAN 66X45 STRL (DRAPES) ×2 IMPLANT
DRAPE U-SHAPE 47X51 STRL (DRAPES) ×2 IMPLANT
DRESSING AQUACEL AG SP 3.5X10 (GAUZE/BANDAGES/DRESSINGS) ×1 IMPLANT
DRSG AQUACEL AG ADV 3.5X10 (GAUZE/BANDAGES/DRESSINGS) ×1 IMPLANT
DRSG AQUACEL AG SP 3.5X10 (GAUZE/BANDAGES/DRESSINGS) ×2
DURAPREP 26ML APPLICATOR (WOUND CARE) ×4 IMPLANT
ELECT REM PT RETURN 15FT ADLT (MISCELLANEOUS) ×2 IMPLANT
GLOVE SURG ENC MOIS LTX SZ6 (GLOVE) ×2 IMPLANT
GLOVE SURG ENC MOIS LTX SZ7 (GLOVE) ×2 IMPLANT
GLOVE SURG UNDER POLY LF SZ7.5 (GLOVE) ×2 IMPLANT
GOWN STRL REUS W/TWL LRG LVL3 (GOWN DISPOSABLE) ×2 IMPLANT
HANDPIECE INTERPULSE COAX TIP (DISPOSABLE) ×2
HOLDER FOLEY CATH W/STRAP (MISCELLANEOUS) IMPLANT
KIT TURNOVER KIT A (KITS) IMPLANT
MANIFOLD NEPTUNE II (INSTRUMENTS) ×2 IMPLANT
NDL SAFETY ECLIPSE 18X1.5 (NEEDLE) IMPLANT
NEEDLE HYPO 18GX1.5 SHARP (NEEDLE)
NS IRRIG 1000ML POUR BTL (IV SOLUTION) ×2 IMPLANT
PACK TOTAL KNEE CUSTOM (KITS) ×2 IMPLANT
PADDING CAST COTTON 6X4 STRL (CAST SUPPLIES) ×1 IMPLANT
PROTECTOR NERVE ULNAR (MISCELLANEOUS) ×2 IMPLANT
SET HNDPC FAN SPRY TIP SCT (DISPOSABLE) ×1 IMPLANT
SET PAD KNEE POSITIONER (MISCELLANEOUS) ×2 IMPLANT
SPONGE T-LAP 18X18 ~~LOC~~+RFID (SPONGE) ×6 IMPLANT
SUT MNCRL AB 4-0 PS2 18 (SUTURE) ×2 IMPLANT
SUT STRATAFIX PDS+ 0 24IN (SUTURE) ×2 IMPLANT
SUT VIC AB 1 CT1 36 (SUTURE) ×2 IMPLANT
SUT VIC AB 2-0 CT1 27 (SUTURE) ×6
SUT VIC AB 2-0 CT1 TAPERPNT 27 (SUTURE) ×3 IMPLANT
SYR 3ML LL SCALE MARK (SYRINGE) ×2 IMPLANT
TRAY FOLEY MTR SLVR 16FR STAT (SET/KITS/TRAYS/PACK) ×2 IMPLANT
TUBE SUCTION HIGH CAP CLEAR NV (SUCTIONS) ×2 IMPLANT
WATER STERILE IRR 1000ML POUR (IV SOLUTION) ×4 IMPLANT
WRAP KNEE MAXI GEL POST OP (GAUZE/BANDAGES/DRESSINGS) ×2 IMPLANT

## 2021-05-14 NOTE — Anesthesia Postprocedure Evaluation (Signed)
Anesthesia Post Note  Patient: Savannah Martinez  Procedure(s) Performed: TOTAL KNEE ARTHROPLASTY (Left: Knee)     Patient location during evaluation: PACU Anesthesia Type: Spinal Level of consciousness: oriented and awake and alert Pain management: pain level controlled Vital Signs Assessment: post-procedure vital signs reviewed and stable Respiratory status: spontaneous breathing, respiratory function stable and patient connected to nasal cannula oxygen Cardiovascular status: blood pressure returned to baseline and stable Postop Assessment: no headache, no backache and no apparent nausea or vomiting Anesthetic complications: no   No notable events documented.  Last Vitals:  Vitals:   05/14/21 1100 05/14/21 1115  BP: (!) 147/86 (!) 142/83  Pulse:  97  Resp: 16 15  Temp:    SpO2: 98% 99%    Last Pain:  Vitals:   05/14/21 1115  TempSrc:   PainSc: 4                  Raeli Wiens S

## 2021-05-14 NOTE — Plan of Care (Signed)
°  Problem: Education: Goal: Knowledge of General Education information will improve Description: Including pain rating scale, medication(s)/side effects and non-pharmacologic comfort measures Outcome: Progressing   Problem: Clinical Measurements: Goal: Ability to maintain clinical measurements within normal limits will improve Outcome: Progressing   Problem: Activity: Goal: Risk for activity intolerance will decrease Outcome: Progressing   Problem: Nutrition: Goal: Adequate nutrition will be maintained Outcome: Progressing   Problem: Elimination: Goal: Will not experience complications related to bowel motility Outcome: Progressing   Problem: Pain Managment: Goal: General experience of comfort will improve Outcome: Progressing   Problem: Education: Goal: Knowledge of the prescribed therapeutic regimen will improve Outcome: Progressing   Problem: Activity: Goal: Ability to avoid complications of mobility impairment will improve Outcome: Progressing   Problem: Clinical Measurements: Goal: Postoperative complications will be avoided or minimized Outcome: Progressing   Problem: Pain Management: Goal: Pain level will decrease with appropriate interventions Outcome: Progressing

## 2021-05-14 NOTE — Anesthesia Procedure Notes (Signed)
Spinal  Patient location during procedure: OR Start time: 05/14/2021 7:50 AM End time: 05/14/2021 7:50 AM Reason for block: surgical anesthesia Staffing Performed: resident/CRNA  Resident/CRNA: Kevaughn Ewing, CRNA Preanesthetic Checklist Completed: patient identified, IV checked, site marked, risks and benefits discussed, surgical consent, monitors and equipment checked, pre-op evaluation and timeout performed Spinal Block Patient position: sitting Prep: DuraPrep Patient monitoring: heart rate, cardiac monitor, continuous pulse ox and blood pressure Approach: midline Location: L3-4 Injection technique: single-shot Needle Needle type: Sprotte  Needle gauge: 24 G Needle length: 9 cm Needle insertion depth: 6 cm Assessment Sensory level: T4 Events: CSF return Additional Notes IV functioning, monitors applied to pt. Expiration date of kit checked and confirmed to be in date. Sterile prep and drape, hand hygiene and sterile gloved used. Pt was positioned and spine was prepped in sterile fashion. Skin was anesthetized with lidocaine. Free flow of clear CSF obtained prior to injecting local anesthetic into CSF x 1 attempt. Spinal needle aspirated freely following injection. Needle was carefully withdrawn, and pt tolerated procedure well. Loss of motor and sensory on exam post injection.

## 2021-05-14 NOTE — Interval H&P Note (Signed)
History and Physical Interval Note:  05/14/2021 6:42 AM  Savannah Martinez  has presented today for surgery, with the diagnosis of Left knee osteoarthritis.  The various methods of treatment have been discussed with the patient and family. After consideration of risks, benefits and other options for treatment, the patient has consented to  Procedure(s): TOTAL KNEE ARTHROPLASTY (Left) as a surgical intervention.  The patient's history has been reviewed, patient examined, no change in status, stable for surgery.  I have reviewed the patient's chart and labs.  Questions were answered to the patient's satisfaction.     Mauri Pole

## 2021-05-14 NOTE — Evaluation (Signed)
Physical Therapy Evaluation Patient Details Name: Savannah Martinez MRN: [id number] DOB: [birth year] Today's Date: 05/14/2021  History of Present Illness  Pt 66 yo female s/p L TKA 05/14/20.  She has hx including arthritis, chronic pain, and L partial knee.  Clinical Impression  Pt is s/p TKA resulting in the deficits listed below (see PT Problem List). At baseline, pt is independent.  She will have support from dtr and friends at d/c.  Today, she ambulated 24' with RW and min guard, she had excellent quad activation, ROM, and pain control.  Pt expected to progress very well. Pt will benefit from skilled PT to increase their independence and safety with mobility to allow discharge to the venue listed below.         Recommendations for follow up therapy are one component of a multi-disciplinary discharge planning process, led by the attending physician.  Recommendations may be updated based on patient status, additional functional criteria and insurance authorization.  Follow Up Recommendations Follow physician's recommendations for discharge plan and follow up therapies    Assistance Recommended at Discharge Intermittent Supervision/Assistance  Patient can return home with the following  Assistance with cooking/housework;Other (comment) (supervision for mobility)    Equipment Recommendations Rolling walker (2 wheels)  Recommendations for Other Services       Functional Status Assessment Patient has had a recent decline in their functional status and demonstrates the ability to make significant improvements in function in a reasonable and predictable amount of time.     Precautions / Restrictions Precautions Precautions: Fall Restrictions Weight Bearing Restrictions: Yes LLE Weight Bearing: Weight bearing as tolerated      Mobility  Bed Mobility Overal bed mobility: Needs Assistance Bed Mobility: Supine to Sit     Supine to sit: Min guard     General bed mobility comments: cues  for sequencing    Transfers Overall transfer level: Needs assistance Equipment used: Rolling walker (2 wheels) Transfers: Sit to/from Stand Sit to Stand: Min guard           General transfer comment: cues for hand placement and L LE management    Ambulation/Gait Ambulation/Gait assistance: Min guard Gait Distance (Feet): 80 Feet Assistive device: Rolling walker (2 wheels) Gait Pattern/deviations: Step-to pattern;Decreased weight shift to left Gait velocity: decreased     General Gait Details: Cues for sequencing and RW proximity  Stairs            Wheelchair Mobility    Modified Rankin (Stroke Patients Only)       Balance Overall balance assessment: Needs assistance Sitting-balance support: No upper extremity supported Sitting balance-Leahy Scale: Good     Standing balance support: Bilateral upper extremity supported Standing balance-Leahy Scale: Fair Standing balance comment: RW to ambulate but could static stand without AD                             Pertinent Vitals/Pain Pain Assessment: 0-10 Pain Score: 3  Pain Location: L knee Pain Descriptors / Indicators: Discomfort;Sore Pain Intervention(s): Limited activity within patient's tolerance;Monitored during session    Home Living Family/patient expects to be discharged to:: Private residence Living Arrangements: Children Available Help at Discharge: Family (reports daughter or friends will be there "most" of the time) Type of Home: House Home Access: Stairs to enter Entrance Stairs-Rails: Left Entrance Stairs-Number of Steps: 2   Home Layout: One level;Laundry or work area in [employer] Equipment: Tub bench  Prior Function Prior Level of Function : Independent/Modified Independent;Driving             Mobility Comments: Could ambulate in community; walked for exercise ADLs Comments: Independent with ADLs and IADLS     Hand Dominance        Extremity/Trunk  Assessment   Upper Extremity Assessment Upper Extremity Assessment: Overall WFL for tasks assessed    Lower Extremity Assessment Lower Extremity Assessment: LLE deficits/detail;RLE deficits/detail RLE Deficits / Details: ROM WFL; MMT 5/5 LLE Deficits / Details: Expected post op changes; ROM: knee 0 to 90; ankle and hip WFL; MMT: ankle 5/5, knee 3/5, hip 3/5    Cervical / Trunk Assessment Cervical / Trunk Assessment: Normal  Communication   Communication: No difficulties  Cognition Arousal/Alertness: Awake/alert Behavior During Therapy: WFL for tasks assessed/performed Overall Cognitive Status: Within Functional Limits for tasks assessed                                          General Comments General comments (skin integrity, edema, etc.): VSS; encouraged to do quad sets and ankle pumps tonight; educated on short ambulation every 1-2 hours at home and resting with leg straight    Exercises Total Joint Exercises Ankle Circles/Pumps: AROM;Both;5 reps;Seated Quad Sets: AROM;Both;5 reps;Seated   Assessment/Plan    PT Assessment Patient needs continued PT services  PT Problem List Decreased strength;Decreased mobility;Decreased safety awareness;Decreased range of motion;Decreased activity tolerance;Decreased balance;Decreased knowledge of use of DME;Pain       PT Treatment Interventions DME instruction;Therapeutic activities;Modalities;Gait training;Therapeutic exercise;Patient/family education;Stair training;Functional mobility training    PT Goals (Current goals can be found in the Care Plan section)  Acute Rehab PT Goals Patient Stated Goal: return home PT Goal Formulation: With patient Time For Goal Achievement: 05/28/21 Potential to Achieve Goals: Good    Frequency 7X/week     Co-evaluation               AM-PAC PT "6 Clicks" Mobility  Outcome Measure Help needed turning from your back to your side while in a flat bed without using  bedrails?: A Little Help needed moving from lying on your back to sitting on the side of a flat bed without using bedrails?: A Little Help needed moving to and from a bed to a chair (including a wheelchair)?: A Little Help needed standing up from a chair using your arms (e.g., wheelchair or bedside chair)?: A Little Help needed to walk in hospital room?: A Little Help needed climbing 3-5 steps with a railing? : A Little 6 Click Score: 18    End of Session Equipment Utilized During Treatment: Gait belt Activity Tolerance: Patient tolerated treatment well Patient left: with call bell/phone within reach;with chair alarm set;in chair Nurse Communication: Mobility status PT Visit Diagnosis: Other abnormalities of gait and mobility (R26.89);Muscle weakness (generalized) (M62.81)    Time: 0034-9179 PT Time Calculation (min) (ACUTE ONLY): 45 min   Charges:   PT Evaluation $PT Eval Low Complexity: 1 Low PT Treatments $Gait Training: 8-22 mins $Therapeutic Activity: 8-22 mins        Anise Salvo, PT Acute Rehab Services Pager [phone] [facility] [phone]   Rayetta Humphrey 05/14/2021, 3:55 PM

## 2021-05-14 NOTE — Anesthesia Procedure Notes (Signed)
Anesthesia Procedure Image    

## 2021-05-14 NOTE — Anesthesia Preprocedure Evaluation (Signed)
Anesthesia Evaluation  Patient identified by MRN, date of birth, ID band Patient awake    Reviewed: Allergy & Precautions, NPO status , Patient's Chart, lab work & pertinent test results  Airway Mallampati: II  TM Distance: >3 FB Neck ROM: Full    Dental no notable dental hx.    Pulmonary neg pulmonary ROS,    Pulmonary exam normal breath sounds clear to auscultation       Cardiovascular negative cardio ROS Normal cardiovascular exam Rhythm:Regular Rate:Normal     Neuro/Psych negative neurological ROS  negative psych ROS   GI/Hepatic negative GI ROS, Neg liver ROS,   Endo/Other  negative endocrine ROS  Renal/GU negative Renal ROS  negative genitourinary   Musculoskeletal  (+) Arthritis , Osteoarthritis,    Abdominal   Peds negative pediatric ROS (+)  Hematology negative hematology ROS (+)   Anesthesia Other Findings   Reproductive/Obstetrics negative OB ROS                             Anesthesia Physical Anesthesia Plan  ASA: 2  Anesthesia Plan: Spinal   Post-op Pain Management: Regional block   Induction: Intravenous  PONV Risk Score and Plan: 3 and Propofol infusion, Ondansetron, Dexamethasone and Treatment may vary due to age or medical condition  Airway Management Planned: Simple Face Mask  Additional Equipment:   Intra-op Plan:   Post-operative Plan:   Informed Consent: I have reviewed the patients History and Physical, chart, labs and discussed the procedure including the risks, benefits and alternatives for the proposed anesthesia with the patient or authorized representative who has indicated his/her understanding and acceptance.     Dental advisory given  Plan Discussed with: CRNA and Surgeon  Anesthesia Plan Comments:         Anesthesia Quick Evaluation

## 2021-05-14 NOTE — Anesthesia Procedure Notes (Signed)
Anesthesia Regional Block: Adductor canal block   Pre-Anesthetic Checklist: , timeout performed,  Correct Patient, Correct Site, Correct Laterality,  Correct Procedure, Correct Position, site marked,  Risks and benefits discussed,  Surgical consent,  Pre-op evaluation,  At surgeon's request and post-op pain management  Laterality: Left  Prep: chloraprep       Needles:  Injection technique: Single-shot  Needle Type: Echogenic Needle     Needle Length: 9cm      Additional Needles:   Procedures:,,,, ultrasound used (permanent image in chart),,    Narrative:  Start time: 05/14/2021 6:55 AM End time: 05/14/2021 7:02 AM Injection made incrementally with aspirations every 5 mL.  Performed by: Personally  Anesthesiologist: Eilene Ghazi, MD  Additional Notes: Patient tolerated the procedure well without complications

## 2021-05-14 NOTE — Transfer of Care (Signed)
Immediate Anesthesia Transfer of Care Note  Patient: Savannah Martinez  Procedure(s) Performed: Procedure(s): TOTAL KNEE ARTHROPLASTY (Left)  Patient Location: PACU  Anesthesia Type:Spinal  Level of Consciousness:  sedated, patient cooperative and responds to stimulation  Airway & Oxygen Therapy:Patient Spontanous Breathing and Patient connected to face mask oxgen  Post-op Assessment:  Report given to PACU RN and Post -op Vital signs reviewed and stable  Post vital signs:  Reviewed and stable  Last Vitals:  Vitals:   05/14/21 0612  BP: (!) 134/99  Pulse: 81  Resp: 16  Temp: 36.6 C  SpO2: 100%    Complications: No apparent anesthesia complications

## 2021-05-14 NOTE — Discharge Instructions (Signed)

## 2021-05-14 NOTE — Op Note (Signed)
NAME:  Savannah Martinez                      MEDICAL RECORD NO.:  [id number]                             FACILITY:  [facility]      PHYSICIAN:  Madlyn Frankel. Charlann Boxer, M.D.  DATE OF BIRTH:  1956-03-22      DATE OF PROCEDURE:  05/14/2021                                     OPERATIVE REPORT         PREOPERATIVE DIAGNOSIS:  Left knee osteoarthritis.      POSTOPERATIVE DIAGNOSIS:  Left knee osteoarthritis.      FINDINGS:  The patient was noted to have complete loss of cartilage and   bone-on-bone arthritis with associated osteophytes in the lateral and patellofemoral compartments of   the knee with a significant synovitis and associated effusion.  The patient had failed months of conservative treatment including medications, injection therapy, activity modification.     PROCEDURE:  Left total knee replacement.      COMPONENTS USED:  DePuy Attune rotating platform posterior stabilized knee   system, a size 4N femur, 4 tibia, size 8 mm PS AOX insert, and 35 anatomic patellar   button.      SURGEON:  Madlyn Frankel. Charlann Boxer, M.D.      ASSISTANT:  Rosalene Billings, PA-C.      ANESTHESIA:  Regional and Spinal.      SPECIMENS:  None.      COMPLICATION:  None.      DRAINS:  None.  EBL: <200cc      TOURNIQUET TIME:   Total Tourniquet Time Documented: Thigh (Left) - 36 minutes Total: Thigh (Left) - 36 minutes  .      The patient was stable to the recovery room.      INDICATION FOR PROCEDURE:  Savannah Martinez is a 66 y.o. female patient of   mine.  The patient had been seen, evaluated, and treated for months conservatively in the   office with medication, activity modification, and injections.  The patient had   radiographic changes of bone-on-bone arthritis with endplate sclerosis and osteophytes noted.  Based on the radiographic changes and failed conservative measures, the patient   decided to proceed with definitive treatment, total knee replacement.  Risks of infection, DVT, component failure, need for  revision surgery, neurovascular injury were reviewed in the office setting.  The postop course was reviewed stressing the efforts to maximize post-operative satisfaction and function.  Consent was obtained for benefit of pain   relief.      PROCEDURE IN DETAIL:  The patient was brought to the operative theater.   Once adequate anesthesia, preoperative antibiotics, 2 gm of Ancef, 1 gm of Vancomycin (MRSA+),1 gm of Tranexamic Acid, and 10 mg of Decadron administered, the patient was positioned supine with a left thigh tourniquet placed.  The  left lower extremity was prepped and draped in sterile fashion.  A time-   out was performed identifying the patient, planned procedure, and the appropriate extremity.      The left lower extremity was placed in the [facility] leg holder.  The leg was   exsanguinated, tourniquet elevated to 250 mmHg.  A midline  incision was   made followed by median parapatellar arthrotomy.  Following initial   exposure, attention was first directed to the patella.  Precut   measurement was noted to be 22 mm.  I resected down to 13 mm and used a   35 anatomic patellar button to restore patellar height as well as cover the cut surface.      The lug holes were drilled and a metal shim was placed to protect the   patella from retractors and saw blade during the procedure.      At this point, attention was now directed to the femur.  The femoral   canal was opened with a drill, irrigated to try to prevent fat emboli.  An   intramedullary rod was passed at 3 degrees valgus, 11 mm of bone was   resected off the distal femur.  Following this resection, the tibia was   subluxated anteriorly.  Using the extramedullary guide, 3 mm of bone was resected off   the proximal medial tibia.  We confirmed the gap would be   stable medially and laterally with a size 6 spacer block as well as confirmed that the tibial cut was perpendicular in the coronal plane, checking with an alignment rod.       Once this was done, I sized the femur to be a size 4 in the anterior-   posterior dimension, chose a narrow component based on medial and   lateral dimension.  The size 4 rotation block was then pinned in   position anterior referenced using the C-clamp to set rotation.  The   anterior, posterior, and  chamfer cuts were made without difficulty nor   notching making certain that I was along the anterior cortex to help   with flexion gap stability.      The final box cut was made off the lateral aspect of distal femur.      At this point, the tibia was sized to be a size 4.  The size 4 tray was   then pinned in position through the medial third of the tubercle,   drilled, and keel punched.  Trial reduction was now carried with a 4 femur,  4 tibia, a size 8 mm PS insert, and the 35 anatomic patella botton.  The knee was brought to full extension with good flexion stability with the patella   tracking through the trochlea without application of pressure.  Given   all these findings the trial components removed.  Final components were   opened and cement was mixed.  The knee was irrigated with normal saline solution and pulse lavage.  The synovial lining was   then injected with 30 cc of 0.25% Marcaine with epinephrine, 1 cc of Toradol and 30 cc of NS for a total of 61 cc.     Final implants were then cemented onto cleaned and dried cut surfaces of bone with the knee brought to extension with a size 8 mm PS trial insert.      Once the cement had fully cured, excess cement was removed   throughout the knee.  I confirmed that I was satisfied with the range of   motion and stability, and the final size 8 mm PS AOX insert was chosen.  It was   placed into the knee.      The tourniquet had been let down at 36 minutes.  No significant   hemostasis was required.  The extensor mechanism was then  reapproximated using #1 Vicryl and #1 Stratafix sutures with the knee   in flexion.  The   remaining  wound was closed with 2-0 Vicryl and running 4-0 Monocryl.   The knee was cleaned, dried, dressed sterilely using Dermabond and   Aquacel dressing.  The patient was then   brought to recovery room in stable condition, tolerating the procedure   well.   Please note that Physician Assistant, Rosalene Billings, PA-C was present for the entirety of the case, and was utilized for pre-operative positioning, peri-operative retractor management, general facilitation of the procedure and for primary wound closure at the end of the case.              Madlyn Frankel Charlann Boxer, M.D.    05/14/2021 9:01 AM

## 2021-05-14 NOTE — Progress Notes (Signed)
PT Cancellation Note  Patient Details Name: Savannah Martinez MRN: [id number] DOB: 03-04-1956   Cancelled Treatment:    Reason Eval/Treat Not Completed: Other (comment) Pt still with some decreased sensation in feet and buttock from spinal.  Will f/u later today. Anise Salvo, PT Acute Rehab Services Pager [phone] [facility] [phone]   Rayetta Humphrey 05/14/2021, 12:51 PM

## 2021-05-15 ENCOUNTER — Encounter (HOSPITAL_COMMUNITY): Payer: Self-pay | Admitting: Orthopedic Surgery

## 2021-05-15 DIAGNOSIS — M1712 Unilateral primary osteoarthritis, left knee: Secondary | ICD-10-CM | POA: Diagnosis not present

## 2021-05-15 LAB — CBC
HCT: 34.2 % — ABNORMAL LOW (ref 36.0–46.0)
Hemoglobin: 11.3 g/dL — ABNORMAL LOW (ref 12.0–15.0)
MCH: 31.1 pg (ref 26.0–34.0)
MCHC: 33 g/dL (ref 30.0–36.0)
MCV: 94.2 fL (ref 80.0–100.0)
Platelets: 210 10*3/uL (ref 150–400)
RBC: 3.63 MIL/uL — ABNORMAL LOW (ref 3.87–5.11)
RDW: 12.7 % (ref 11.5–15.5)
WBC: 20.8 10*3/uL — ABNORMAL HIGH (ref 4.0–10.5)
nRBC: 0 % (ref 0.0–0.2)

## 2021-05-15 LAB — BASIC METABOLIC PANEL
Anion gap: 8 (ref 5–15)
BUN: 10 mg/dL (ref 8–23)
CO2: 27 mmol/L (ref 22–32)
Calcium: 8.7 mg/dL — ABNORMAL LOW (ref 8.9–10.3)
Chloride: 105 mmol/L (ref 98–111)
Creatinine, Ser: 0.62 mg/dL (ref 0.44–1.00)
GFR, Estimated: >60 mL/min (ref >60)
Glucose, Bld: 110 mg/dL — ABNORMAL HIGH (ref 70–99)
Potassium: 4.9 mmol/L (ref 3.5–5.1)
Sodium: 140 mmol/L (ref 135–145)

## 2021-05-15 MED ORDER — POLYETHYLENE GLYCOL 3350 17 G PO PACK
17.0000 g | PACK | Freq: Every day | ORAL | Status: DC
  Administered 2021-05-15: 17 g via ORAL
  Filled 2021-05-15: qty 1

## 2021-05-15 MED ORDER — DOCUSATE SODIUM 100 MG PO CAPS: 100.0000 mg | ORAL_CAPSULE | Freq: Two times a day (BID) | ORAL | 0 refills | Status: DC

## 2021-05-15 MED ORDER — ACETAMINOPHEN 500 MG PO TABS
1000.0000 mg | ORAL_TABLET | Freq: Four times a day (QID) | ORAL | Status: DC
  Administered 2021-05-15: 1000 mg via ORAL
  Filled 2021-05-15: qty 2

## 2021-05-15 MED ORDER — OXYCODONE HCL 5 MG PO TABS
5.0000 mg | ORAL_TABLET | ORAL | 0 refills | Status: DC | PRN
Start: 2021-05-15 — End: 2023-04-28

## 2021-05-15 MED ORDER — METHOCARBAMOL 500 MG PO TABS: 500.0000 mg | ORAL_TABLET | Freq: Four times a day (QID) | ORAL | 0 refills | Status: DC | PRN

## 2021-05-15 MED ORDER — AMPHETAMINE-DEXTROAMPHETAMINE 10 MG PO TABS
15.0000 mg | ORAL_TABLET | Freq: Every day | ORAL | Status: DC
  Filled 2021-05-15: qty 2

## 2021-05-15 MED ORDER — ASPIRIN 81 MG PO CHEW: 81.0000 mg | CHEWABLE_TABLET | Freq: Two times a day (BID) | ORAL | 0 refills | Status: AC

## 2021-05-15 MED ORDER — CELECOXIB 200 MG PO CAPS: 200.0000 mg | ORAL_CAPSULE | Freq: Two times a day (BID) | ORAL | 1 refills | Status: DC

## 2021-05-15 MED ORDER — POLYETHYLENE GLYCOL 3350 17 G PO PACK: 17.0000 g | PACK | Freq: Every day | ORAL | 0 refills | Status: DC

## 2021-05-15 NOTE — Plan of Care (Signed)
  Problem: Safety: Goal: Ability to remain free from injury will improve Outcome: Progressing   Problem: Clinical Measurements: Goal: Postoperative complications will be avoided or minimized Outcome: Progressing   Problem: Pain Management: Goal: Pain level will decrease with appropriate interventions Outcome: Progressing   

## 2021-05-15 NOTE — Progress Notes (Signed)
Physical Therapy Treatment Patient Details Name: Savannah Martinez MRN: [id number] DOB: 14-Dec-1955 Today's Date: 05/15/2021   History of Present Illness Pt 66 yo female s/p L TKA 05/14/20.  She has hx including arthritis, chronic pain, and L partial knee.    PT Comments    Pt has met PT goals and is ready to DC from a PT standpoint. Pt ambulated 130' with RW, completed stair training, and demonstrates good understanding of HEP.    Recommendations for follow up therapy are one component of a multi-disciplinary discharge planning process, led by the attending physician.  Recommendations may be updated based on patient status, additional functional criteria and insurance authorization.  Follow Up Recommendations  Follow physician's recommendations for discharge plan and follow up therapies     Assistance Recommended at Discharge Intermittent Supervision/Assistance  Patient can return home with the following Assistance with cooking/housework;Assist for transportation;Help with stairs or ramp for entrance   Equipment Recommendations  Rolling walker (2 wheels)    Recommendations for Other Services       Precautions / Restrictions Precautions Precautions: Fall;Knee Precaution Booklet Issued: Yes (comment) Precaution Comments: reviewed no pillow under knee Restrictions Weight Bearing Restrictions: No LLE Weight Bearing: Weight bearing as tolerated     Mobility  Bed Mobility Overal bed mobility: Modified Independent Bed Mobility: Supine to Sit     Supine to sit: Modified independent (Device/Increase time)          Transfers Overall transfer level: Needs assistance Equipment used: Rolling walker (2 wheels) Transfers: Sit to/from Stand Sit to Stand: Supervision           General transfer comment: cues for hand placement and L LE management    Ambulation/Gait Ambulation/Gait assistance: Supervision Gait Distance (Feet): 130 Feet Assistive device: Rolling walker (2  wheels) Gait Pattern/deviations: Step-to pattern;Decreased weight shift to left Gait velocity: decreased     General Gait Details: no LOB   Stairs Stairs: Yes Stairs assistance: Min assist Stair Management: No rails;Backwards;Step to pattern;With walker Number of Stairs: 2 General stair comments: VCs sequencing, min A to manage RW   Wheelchair Mobility    Modified Rankin (Stroke Patients Only)       Balance Overall balance assessment: Needs assistance Sitting-balance support: No upper extremity supported Sitting balance-Leahy Scale: Good     Standing balance support: Bilateral upper extremity supported Standing balance-Leahy Scale: Fair Standing balance comment: RW to ambulate but could static stand without AD                            Cognition Arousal/Alertness: Awake/alert Behavior During Therapy: WFL for tasks assessed/performed Overall Cognitive Status: Within Functional Limits for tasks assessed                                          Exercises Total Joint Exercises Ankle Circles/Pumps: AROM;Both;5 reps;Seated Quad Sets: AROM;Both;5 reps;Seated Short Arc Quad: AROM;Left;5 reps;Supine Heel Slides: AAROM;Left;5 reps;Supine Hip ABduction/ADduction: AROM;Left;5 reps;Supine Straight Leg Raises: AROM;Left;5 reps;Supine Long Arc Quad: AROM;Left;5 reps;Seated Knee Flexion: AAROM;Left;10 reps;Seated Goniometric ROM: 5-70* AAROM L knee    General Comments        Pertinent Vitals/Pain Pain Score: 4  Pain Location: L knee Pain Descriptors / Indicators: Discomfort;Sore Pain Intervention(s): Limited activity within patient's tolerance;Monitored during session;Premedicated before session;Ice applied    Home Living  Prior Function            PT Goals (current goals can now be found in the care plan section) Acute Rehab PT Goals Patient Stated Goal: return home PT Goal Formulation: With  patient Time For Goal Achievement: 05/28/21 Potential to Achieve Goals: Good Progress towards PT goals: Progressing toward goals    Frequency    7X/week      PT Plan      Co-evaluation              AM-PAC PT "6 Clicks" Mobility   Outcome Measure  Help needed turning from your back to your side while in a flat bed without using bedrails?: A Little Help needed moving from lying on your back to sitting on the side of a flat bed without using bedrails?: None Help needed moving to and from a bed to a chair (including a wheelchair)?: None Help needed standing up from a chair using your arms (e.g., wheelchair or bedside chair)?: None Help needed to walk in hospital room?: None Help needed climbing 3-5 steps with a railing? : A Little 6 Click Score: 22    End of Session Equipment Utilized During Treatment: Gait belt Activity Tolerance: Patient tolerated treatment well Patient left: with call bell/phone within reach;with chair alarm set;in chair Nurse Communication: Mobility status PT Visit Diagnosis: Other abnormalities of gait and mobility (R26.89);Muscle weakness (generalized) (M62.81)     Time: 1051-1140 PT Time Calculation (min) (ACUTE ONLY): 49 min  Charges:  $Gait Training: 8-22 mins $Therapeutic Exercise: 8-22 mins $Therapeutic Activity: 8-22 mins                    Savannah Martinez PT 05/15/2021  Acute Rehabilitation Services Pager [phone] Office [phone]

## 2021-05-15 NOTE — TOC Transition Note (Signed)
Transition of Care [facility]) - CM/SW Discharge Note   Patient Details  Name: Savannah Martinez MRN: [id number] Date of Birth: 1956/05/04  Transition of Care [facility]) CM/SW Contact:  Lennart Pall, LCSW Phone Number: 05/15/2021, 12:34 PM   Clinical Narrative:    Met with pt and confirming she has received rw via [place].  Plan for OPPT at [facility].  No further TOC needs.   Final next level of care: OP Rehab Barriers to Discharge: No Barriers Identified   Patient Goals and CMS Choice Patient states their goals for this hospitalization and ongoing recovery are:: return home      Discharge Placement                       Discharge Plan and Services                DME Arranged: Walker rolling DME Agency: [employer] (prearranged with [employer])                  Social Determinants of Health (SDOH) Interventions     Readmission Risk Interventions No flowsheet data found.

## 2021-05-15 NOTE — Progress Notes (Signed)
° °  Subjective: 1 Day Post-Op Procedure(s) (LRB): TOTAL KNEE ARTHROPLASTY (Left) Patient reports pain as mild.   Patient seen in rounds for Dr. Charlann Boxer. Patient is well, and has had no acute complaints or problems. No acute events overnight. Foley catheter removed. Patient ambulated 80 feet with PT. She has been sore today, and feels she may have walked too far yesterday. We will continue therapy today.   Objective: Vital signs in last 24 hours: Temp:  [97.6 F (36.4 C)-99.5 F (37.5 C)] 98.3 F (36.8 C) (01/04 0945) Pulse Rate:  [85-107] 85 (01/04 0945) Resp:  [15-18] 18 (01/04 0945) BP: (114-152)/(72-87) 120/77 (01/04 0945) SpO2:  [93 %-98 %] 95 % (01/04 0945)  Intake/Output from previous day:  Intake/Output Summary (Last 24 hours) at 05/15/2021 1245 Last data filed at 05/15/2021 0600 Gross per 24 hour  Intake 1952.54 ml  Output 2270 ml  Net -317.46 ml     Intake/Output this shift: No intake/output data recorded.  Labs: Recent Labs    05/15/21 0325  HGB 11.3*   Recent Labs    05/15/21 0325  WBC 20.8*  RBC 3.63*  HCT 34.2*  PLT 210   Recent Labs    05/15/21 0325  NA 140  K 4.9  CL 105  CO2 27  BUN 10  CREATININE 0.62  GLUCOSE 110*  CALCIUM 8.7*   No results for input(s): LABPT, INR in the last 72 hours.  Exam: General - Patient is Alert and Oriented Extremity - Neurologically intact Sensation intact distally Intact pulses distally Dorsiflexion/Plantar flexion intact Dressing - dressing C/D/I Motor Function - intact, moving foot and toes well on exam.   Past Medical History:  Diagnosis Date   Arthritis    Chronic pain    Headache    History of kidney stones    PONV (postoperative nausea and vomiting)     Assessment/Plan: 1 Day Post-Op Procedure(s) (LRB): TOTAL KNEE ARTHROPLASTY (Left) Principal Problem:   S/P total knee arthroplasty, left  Estimated body mass index is 26.95 kg/m as calculated from the following:   Height as of this  encounter: 5\' 4"  (1.626 m).   Weight as of this encounter: 71.2 kg. Advance diet Up with therapy D/C IV fluids   Patient's anticipated LOS is less than 2 midnights, meeting these requirements: - Younger than 71 - Lives within 1 hour of care - Has a competent adult at home to recover with post-op recover - NO history of  - Chronic pain requiring opiods  - Diabetes  - Coronary Artery Disease  - Heart failure  - Heart attack  - Stroke  - DVT/VTE  - Cardiac arrhythmia  - Respiratory Failure/COPD  - Renal failure  - Anemia  - Advanced Liver disease     DVT Prophylaxis - None Weight bearing as tolerated.  Plan is to go Home after hospital stay. Plan for discharge today following 1-2 sessions of PT as long as they are meeting their goals. Patient is scheduled for OPPT. Follow up in the office in 2 weeks.   74, PA-C Orthopedic Surgery [phone] 05/15/2021, 12:45 PM

## 2021-05-15 NOTE — Progress Notes (Signed)
RN reviewed discharge instructions with patient. All questions answered.   Paperwork given. Prescriptions electronically sent to patient pharmacy.    NT rolled patient down with all belongings to family car.     Effie Janoski, RN  

## 2021-05-23 NOTE — Discharge Summary (Signed)
Physician Discharge Summary   Patient ID: Savannah Martinez MRN: [id number] DOB/AGE: [id number] 68 y.o.  Admit date: 05/14/2021 Discharge date: 05/15/2021  Primary Diagnosis: Left knee osteoarthritis.   Admission Diagnoses:  Past Medical History:  Diagnosis Date   Arthritis    Chronic pain    Headache    History of kidney stones    PONV (postoperative nausea and vomiting)    Discharge Diagnoses:   Principal Problem:   S/P total knee arthroplasty, left  Estimated body mass index is 26.95 kg/m as calculated from the following:   Height as of this encounter: 5\' 4"  (1.626 m).   Weight as of this encounter: 71.2 kg.  Procedure:  Procedure(s) (LRB): TOTAL KNEE ARTHROPLASTY (Left)   Consults: None  HPI: Savannah Martinez is a 66 y.o. female patient of   mine.  The patient had been seen, evaluated, and treated for months conservatively in the   office with medication, activity modification, and injections.  The patient had   radiographic changes of bone-on-bone arthritis with endplate sclerosis and osteophytes noted.  Based on the radiographic changes and failed conservative measures, the patient   decided to proceed with definitive treatment, total knee replacement.  Risks of infection, DVT, component failure, need for revision surgery, neurovascular injury were reviewed in the office setting.  The postop course was reviewed stressing the efforts to maximize post-operative satisfaction and function.  Consent was obtained for benefit of pain   relief.   Laboratory Data: Admission on 05/14/2021, Discharged on 05/15/2021  Component Date Value Ref Range Status   ABO/RH(D) 05/14/2021    Final                   Value:O POS Performed at [facility], [address], [place], [place]    WBC 05/15/2021 20.8 (H)  4.0 - 10.5 K/uL Final   RBC 05/15/2021 3.63 (L)  3.87 - 5.11 MIL/uL Final   Hemoglobin 05/15/2021 11.3 (L)  12.0 - 15.0 g/dL Final   HCT 07/13/2021 34.2  (L)  36.0 - 46.0 % Final   MCV 05/15/2021 94.2  80.0 - 100.0 fL Final   MCH 05/15/2021 31.1  26.0 - 34.0 pg Final   MCHC 05/15/2021 33.0  30.0 - 36.0 g/dL Final   RDW 07/13/2021 12.7  11.5 - 15.5 % Final   Platelets 05/15/2021 210  150 - 400 K/uL Final   nRBC 05/15/2021 0.0  0.0 - 0.2 % Final   Performed at [facility], [address] [place], [place]   Sodium 05/15/2021 140  135 - 145 mmol/L Final   Potassium 05/15/2021 4.9  3.5 - 5.1 mmol/L Final   Chloride 05/15/2021 105  98 - 111 mmol/L Final   CO2 05/15/2021 27  22 - 32 mmol/L Final   Glucose, Bld 05/15/2021 110 (H)  70 - 99 mg/dL Final   Glucose reference range applies only to samples taken after fasting for at least 8 hours.   BUN 05/15/2021 10  8 - 23 mg/dL Final   Creatinine, Ser 05/15/2021 0.62  0.44 - 1.00 mg/dL Final   Calcium 07/13/2021 8.7 (L)  8.9 - 10.3 mg/dL Final   GFR, Estimated 05/15/2021 >60  >60 mL/min Final   Comment: (NOTE) Calculated using the CKD-EPI Creatinine Equation (2021)    Anion gap 05/15/2021 8  5 - 15 Final   Performed at [facility], [address], [place], [place]  [facility]  Outpatient Visit on 05/10/2021  Component Date Value Ref Range Status   SARS Coronavirus 2 05/10/2021 NEGATIVE  NEGATIVE Final   Comment: (NOTE) SARS-CoV-2 target nucleic acids are NOT DETECTED.  The SARS-CoV-2 RNA is generally detectable in upper and lower respiratory specimens during the acute phase of infection. Negative results do not preclude SARS-CoV-2 infection, do not rule out co-infections with other pathogens, and should not be used as the sole basis for treatment or other patient management decisions. Negative results must be combined with clinical observations, patient history, and epidemiological information. The expected result is Negative.  Fact Sheet for Patients: HairSlick.no  Fact Sheet for Healthcare  Providers: quierodirigir.com  This test is not yet approved or cleared by the [place] FDA and  has been authorized for detection and/or diagnosis of SARS-CoV-2 by FDA under an Emergency Use Authorization (EUA). This EUA will remain  in effect (meaning this test can be used) for the duration of the COVID-19 declaration under Se                          ction 564(b)(1) of the Act, 21 U.S.C. section 360bbb-3(b)(1), unless the authorization is terminated or revoked sooner.  Performed at [facility], [address] [place], [place] [address]   Hospital Outpatient Visit on 05/01/2021  Component Date Value Ref Range Status   MRSA, PCR 05/01/2021 POSITIVE (A)  NEGATIVE Final   Comment: RESULT CALLED TO, READ BACK BY AND VERIFIED WITH: NEAL,G. RN AT 1419 05/02/21 MULLINS,T    Staphylococcus aureus 05/01/2021 POSITIVE (A)  NEGATIVE Final   Comment: RESULT CALLED TO, READ BACK BY AND VERIFIED WITH: NEAL,G. RN AT 1419 05/02/21 MULLINS,T (NOTE) The Xpert SA Assay (FDA approved for NASAL specimens in patients 47 years of age and older), is one component of a comprehensive surveillance program. It is not intended to diagnose infection nor to guide or monitor treatment. Performed at [facility], [address], [place], [place] [address]    WBC 05/01/2021 8.6  4.0 - 10.5 K/uL Final   RBC 05/01/2021 4.57  3.87 - 5.11 MIL/uL Final   Hemoglobin 05/01/2021 14.5  12.0 - 15.0 g/dL Final   HCT 31/54/0086 42.4  36.0 - 46.0 % Final   MCV 05/01/2021 92.8  80.0 - 100.0 fL Final   MCH 05/01/2021 31.7  26.0 - 34.0 pg Final   MCHC 05/01/2021 34.2  30.0 - 36.0 g/dL Final   RDW 76/19/5093 12.7  11.5 - 15.5 % Final   Platelets 05/01/2021 250  150 - 400 K/uL Final   nRBC 05/01/2021 0.0  0.0 - 0.2 % Final   Performed at [facility], [address], [place], [place] [address]   Sodium 05/01/2021 137  135 - 145 mmol/L Final    Potassium 05/01/2021 4.4  3.5 - 5.1 mmol/L Final   Chloride 05/01/2021 103  98 - 111 mmol/L Final   CO2 05/01/2021 27  22 - 32 mmol/L Final   Glucose, Bld 05/01/2021 102 (H)  70 - 99 mg/dL Final   Glucose reference range applies only to samples taken after fasting for at least 8 hours.   BUN 05/01/2021 13  8 - 23 mg/dL Final   Creatinine, Ser 05/01/2021 0.79  0.44 - 1.00 mg/dL Final   Calcium 45/80/9983 9.2  8.9 - 10.3 mg/dL Final   Total Protein 38/25/0539 7.6  6.5 - 8.1 g/dL Final   Albumin 76/73/4193 4.5  3.5 - 5.0 g/dL Final   AST 16/02/9603 20  15 - 41 U/L Final   ALT 05/01/2021 27  0 - 44 U/L Final   Alkaline Phosphatase 05/01/2021 38  38 - 126 U/L Final   Total Bilirubin 05/01/2021 0.6  0.3 - 1.2 mg/dL Final   GFR, Estimated 05/01/2021 >60  >60 mL/min Final   Comment: (NOTE) Calculated using the CKD-EPI Creatinine Equation (2021)    Anion gap 05/01/2021 7  5 - 15 Final   Performed at [facility], [address], [place], [place] [address]   ABO/RH(D) 05/01/2021 O POS   Final   Antibody Screen 05/01/2021 NEG   Final   Sample Expiration 05/01/2021 05/15/2021,2359   Final   Extend sample reason 05/01/2021    Final                   Value:NO TRANSFUSIONS OR PREGNANCY IN THE PAST 3 MONTHS Performed at [facility], [address], [place], [place] [address]      X-Rays:No results found.  EKG:No orders found for this or any previous visit.   Hospital Course: Savannah Martinez is a 66 y.o. who was admitted to [facility]. They were brought to the operating room on 05/14/2021 and underwent Procedure(s): TOTAL KNEE ARTHROPLASTY.  Patient tolerated the procedure well and was later transferred to the recovery room and then to the orthopaedic floor for postoperative care. They were given PO and IV analgesics for pain control following their surgery. They were given 24 hours of postoperative antibiotics of  Anti-infectives (From admission,  onward)    Start     Dose/Rate Route Frequency Ordered Stop   05/14/21 1400  ceFAZolin (ANCEF) IVPB 2g/100 mL premix        2 g 200 mL/hr over 30 Minutes Intravenous Every 6 hours 05/14/21 1134 05/14/21 2017   05/14/21 0600  ceFAZolin (ANCEF) IVPB 2g/100 mL premix        2 g 200 mL/hr over 30 Minutes Intravenous On call to O.R. 05/14/21 0539 05/14/21 0748   05/14/21 0545  vancomycin (VANCOCIN) IVPB 1000 mg/200 mL premix        1,000 mg 200 mL/hr over 60 Minutes Intravenous  Once 05/14/21 0539 05/14/21 0756      and started on DVT prophylaxis in the form of Aspirin.   PT and OT were ordered for total joint protocol. Discharge planning consulted to help with postop disposition and equipment needs.  Patient had a good night on the evening of surgery. They started to get up OOB with therapy on POD #0. Pt was seen during rounds and was ready to go home pending progress with therapy.She worked with therapy on POD #1 and was meeting her goals. Pt was discharged to home later that day in stable condition.  Diet: Regular diet Activity: WBAT Follow-up: in 2 weeks Disposition: Home Discharged Condition: good   Discharge Instructions     Call MD / Call 911   Complete by: As directed    If you experience chest pain or shortness of breath, CALL 911 and be transported to the hospital emergency room.  If you develope a fever above 101 F, pus (white drainage) or increased drainage or redness at the wound, or calf pain, call your surgeon's office.   Change dressing   Complete by: As directed    Maintain surgical dressing until follow up in the clinic. If the edges start to pull up, may  reinforce with tape. If the dressing is no longer working, may remove and cover with gauze and tape, but must keep the area dry and clean.  Call with any questions or concerns.   Constipation Prevention   Complete by: As directed    Drink plenty of fluids.  Prune juice may be helpful.  You may use a stool softener, such  as Colace (over the counter) 100 mg twice a day.  Use MiraLax (over the counter) for constipation as needed.   Diet - low sodium heart healthy   Complete by: As directed    Increase activity slowly as tolerated   Complete by: As directed    Weight bearing as tolerated with assist device (walker, cane, etc) as directed, use it as long as suggested by your surgeon or therapist, typically at least 4-6 weeks.   Post-operative opioid taper instructions:   Complete by: As directed    POST-OPERATIVE OPIOID TAPER INSTRUCTIONS: It is important to wean off of your opioid medication as soon as possible. If you do not need pain medication after your surgery it is ok to stop day one. Opioids include: Codeine, Hydrocodone(Norco, Vicodin), Oxycodone(Percocet, oxycontin) and hydromorphone amongst others.  Long term and even short term use of opiods can cause: Increased pain response Dependence Constipation Depression Respiratory depression And more.  Withdrawal symptoms can include Flu like symptoms Nausea, vomiting And more Techniques to manage these symptoms Hydrate well Eat regular healthy meals Stay active Use relaxation techniques(deep breathing, meditating, yoga) Do Not substitute Alcohol to help with tapering If you have been on opioids for less than two weeks and do not have pain than it is ok to stop all together.  Plan to wean off of opioids This plan should start within one week post op of your joint replacement. Maintain the same interval or time between taking each dose and first decrease the dose.  Cut the total daily intake of opioids by one tablet each day Next start to increase the time between doses. The last dose that should be eliminated is the evening dose.      TED hose   Complete by: As directed    Use stockings (TED hose) for 2 weeks on both leg(s).  You may remove them at night for sleeping.      Allergies as of 05/15/2021   No Known Allergies      Medication  List     TAKE these medications    amphetamine-dextroamphetamine 30 MG tablet Commonly known as: ADDERALL Take 15 mg by mouth daily. Notes to patient: Not given today per your request   aspirin 81 MG chewable tablet Chew 1 tablet (81 mg total) by mouth 2 (two) times daily for 28 days. Notes to patient: Blood clot prevention   CALCIUM PO Take 1 tablet by mouth daily.   celecoxib 200 MG capsule Commonly known as: CELEBREX Take 1 capsule (200 mg total) by mouth 2 (two) times daily.   cycloSPORINE 0.05 % ophthalmic emulsion Commonly known as: RESTASIS Place 1 drop into both eyes 2 (two) times daily. Notes to patient: Home regimen   diazepam 10 MG tablet Commonly known as: VALIUM Take 5 mg by mouth at bedtime as needed for sleep.   docusate sodium 100 MG capsule Commonly known as: COLACE Take 1 capsule (100 mg total) by mouth 2 (two) times daily. Notes to patient: Stool softener   DULoxetine 30 MG capsule Commonly known as: CYMBALTA Take 30 mg by mouth daily.  estradiol 0.05 MG/24HR patch Commonly known as: VIVELLE-DOT Place 1 patch onto the skin 2 (two) times a week.   estradiol 0.075 MG/24HR Commonly known as: VIVELLE-DOT Place 1 patch onto the skin 2 (two) times a week.   ezetimibe 10 MG tablet Commonly known as: ZETIA Take 10 mg by mouth 2 (two) times a week.   Glucosamine-Chondroitin Tabs Take 1 tablet by mouth daily.   hydroxypropyl methylcellulose / hypromellose 2.5 % ophthalmic solution Commonly known as: ISOPTO TEARS / GONIOVISC Place 1 drop into both eyes 3 (three) times daily as needed for dry eyes.   levothyroxine 50 MCG tablet Commonly known as: SYNTHROID Take 50 mcg by mouth daily before breakfast.   liothyronine 5 MCG tablet Commonly known as: CYTOMEL Take 5 mcg by mouth daily.   methocarbamol 500 MG tablet Commonly known as: ROBAXIN Take 1 tablet (500 mg total) by mouth every 6 (six) hours as needed for muscle spasms.   multivitamin  with minerals Tabs tablet Take 1 tablet by mouth daily.   oxyCODONE 5 MG immediate release tablet Commonly known as: Oxy IR/ROXICODONE Take 1-2 tablets (5-10 mg total) by mouth every 4 (four) hours as needed for severe pain.   polyethylene glycol 17 g packet Commonly known as: MIRALAX / GLYCOLAX Take 17 g by mouth daily.   progesterone 100 MG capsule Commonly known as: PROMETRIUM Take 100 mg by mouth at bedtime.   rizatriptan 10 MG disintegrating tablet Commonly known as: MAXALT-MLT Take 10 mg by mouth 2 (two) times daily as needed for migraine.   rosuvastatin 5 MG tablet Commonly known as: CRESTOR Take 5 mg by mouth daily.               Discharge Care Instructions  (From admission, onward)           Start     Ordered   05/15/21 0000  Change dressing       Comments: Maintain surgical dressing until follow up in the clinic. If the edges start to pull up, may reinforce with tape. If the dressing is no longer working, may remove and cover with gauze and tape, but must keep the area dry and clean.  Call with any questions or concerns.   05/15/21 1234            Follow-up Information     Durene Romanslin, Matthew, MD. Schedule an appointment as soon as possible for a visit in 2 week(s).   Specialty: Orthopedic Surgery Contact information: [address] [place] [id number] [phone]                 Signed: Dennie Bibleshley Dylan Monforte, PA-C Orthopedic Surgery 05/23/2021, 2:14 PM

## 2021-05-30 ENCOUNTER — Ambulatory Visit: Payer: Medicare PPO

## 2021-06-03 ENCOUNTER — Ambulatory Visit: Payer: Medicare PPO

## 2022-03-14 ENCOUNTER — Other Ambulatory Visit: Payer: Self-pay | Admitting: Orthopedic Surgery

## 2022-03-14 DIAGNOSIS — M25511 Pain in right shoulder: Secondary | ICD-10-CM

## 2022-04-21 ENCOUNTER — Ambulatory Visit
Admission: RE | Admit: 2022-04-21 | Discharge: 2022-04-21 | Disposition: A | Discharge status: Home / Self Care | Payer: Medicare PPO | Source: Ambulatory Visit | Attending: Orthopedic Surgery | Admitting: Orthopedic Surgery

## 2022-04-21 DIAGNOSIS — M25511 Pain in right shoulder: Secondary | ICD-10-CM

## 2022-07-24 ENCOUNTER — Other Ambulatory Visit (HOSPITAL_COMMUNITY): Payer: Self-pay | Admitting: Orthopedic Surgery

## 2022-07-24 DIAGNOSIS — Z96652 Presence of left artificial knee joint: Secondary | ICD-10-CM

## 2022-07-24 DIAGNOSIS — M25562 Pain in left knee: Secondary | ICD-10-CM

## 2022-07-25 ENCOUNTER — Other Ambulatory Visit: Payer: Self-pay | Admitting: Orthopedic Surgery

## 2022-07-25 DIAGNOSIS — M25512 Pain in left shoulder: Secondary | ICD-10-CM

## 2022-08-04 ENCOUNTER — Encounter (HOSPITAL_COMMUNITY)
Admission: RE | Admit: 2022-08-04 | Discharge: 2022-08-04 | Disposition: A | Discharge status: Home / Self Care | Payer: Medicare PPO | Source: Ambulatory Visit | Attending: Orthopedic Surgery | Admitting: Orthopedic Surgery

## 2022-08-04 DIAGNOSIS — Z96652 Presence of left artificial knee joint: Secondary | ICD-10-CM | POA: Insufficient documentation

## 2022-08-04 DIAGNOSIS — M25562 Pain in left knee: Secondary | ICD-10-CM | POA: Insufficient documentation

## 2022-08-04 MED ORDER — TECHNETIUM TC 99M MEDRONATE IV KIT
20.0000 | PACK | Freq: Once | INTRAVENOUS | Status: AC | PRN
  Administered 2022-08-04: 20 via INTRAVENOUS

## 2022-08-19 ENCOUNTER — Ambulatory Visit
Admission: RE | Admit: 2022-08-19 | Discharge: 2022-08-19 | Disposition: A | Discharge status: Home / Self Care | Payer: Medicare PPO | Source: Ambulatory Visit | Attending: Orthopedic Surgery | Admitting: Orthopedic Surgery

## 2022-08-19 DIAGNOSIS — M25512 Pain in left shoulder: Secondary | ICD-10-CM

## 2023-02-18 LAB — COLOGUARD

## 2023-05-07 NOTE — Patient Instructions (Addendum)
SURGICAL WAITING ROOM VISITATION Patients having surgery or a procedure may have no more than 2 support people in the waiting area - these visitors may rotate in the visitor waiting room.   Due to an increase in RSV and influenza rates and associated hospitalizations, children ages 61 and under may not visit patients in [facility] hospitals. If the patient needs to stay at the hospital during part of their recovery, the visitor guidelines for inpatient rooms apply.  PRE-OP VISITATION  Pre-op nurse will coordinate an appropriate time for 1 support person to accompany the patient in pre-op.  This support person may not rotate.  This visitor will be contacted when the time is appropriate for the visitor to come back in the pre-op area.  Please refer to the [facility] website for the visitor guidelines for Inpatients (after your surgery is over and you are in a regular room).  You are not required to quarantine at this time prior to your surgery. However, you must do this: Hand Hygiene often Do NOT share personal items Notify your provider if you are in close contact with someone who has COVID or you develop fever 100.4 or greater, new onset of sneezing, cough, sore throat, shortness of breath or body aches.  If you test positive for Covid or have been in contact with anyone that has tested positive in the last 10 days please notify you surgeon.    Your procedure is scheduled on:  TUESDAY  May 19, 2023  Report to [facility] Main Entrance: Le[facility]sen entrance where the [employer] is available.   Report to admitting at: 11:45    AM  Call this number if you have any questions or problems the morning of surgery [phone]  Do not eat food after Midnight the night prior to your surgery/procedure.  After Midnight you may have the following liquids until  11:15 AM  DAY OF SURGERY  Clear Liquid Diet Water Black Coffee (sugar ok, NO MILK/CREAM OR CREAMERS)  Tea (sugar ok, NO  MILK/CREAM OR CREAMERS) regular and decaf                             Plain Jell-O  with no fruit (NO RED)                                           Fruit ices (not with fruit pulp, NO RED)                                     Popsicles (NO RED)                                                                  Juice: NO CITRUS JUICES: only apple, WHITE grape, WHITE cranberry Sports drinks like Gatorade or Powerade (NO RED)                   The day of surgery:  Drink ONE (1) Pre-Surgery Clear Ensure at   11:15 AM the morning of  surgery. Drink in one sitting. Do not sip.  This drink was given to you during your hospital pre-op appointment visit. Nothing else to drink after completing the Pre-Surgery Clear Ensure : No candy, chewing gum or throat lozenges.    FOLLOW ANY ADDITIONAL PRE OP INSTRUCTIONS YOU RECEIVED FROM YOUR SURGEON'S OFFICE!!!   Oral Hygiene is also important to reduce your risk of infection.        Remember - BRUSH YOUR TEETH THE MORNING OF SURGERY WITH YOUR REGULAR TOOTHPASTE  Do NOT smoke after Midnight the night before surgery.  STOP TAKING all Vitamins, Herbs and supplements 1 week before your surgery.   Take ONLY these medicines the morning of surgery with A SIP OF WATER: Duloxetine (Cymbalta), levothyroxine and you may use your Eye drops.                     You may not have any metal on your body including hair pins, jewelry, and body piercing  Do not wear make-up, lotions, powders, perfumes or deodorant  Do not wear nail polish including gel and S&S, artificial / acrylic nails, or any other type of covering on natural nails including finger and toenails. If you have artificial nails, gel coating, etc., that needs to be removed by a nail salon, Please have this removed prior to surgery. Not doing so may mean that your surgery could be cancelled or delayed if the Surgeon or anesthesia staff feels like they are unable to monitor you safely.   Do not shave 48 hours  prior to surgery to avoid nicks in your skin which may contribute to postoperative infections.   Contacts, Hearing Aids, dentures or bridgework may not be worn into surgery. DENTURES WILL BE REMOVED PRIOR TO SURGERY PLEASE DO NOT APPLY "Poly grip" OR ADHESIVES!!!  You may bring a small overnight bag with you on the day of surgery, only pack items that are not valuable. [facility] IS NOT RESPONSIBLE   FOR VALUABLES THAT ARE LOST OR STOLEN.   Do not bring your home medications to the hospital. The Pharmacy will dispense medications listed on your medication list to you during your admission in the Hospital.  Special Instructions: Bring a copy of your healthcare power of attorney and living will documents the day of surgery, if you wish to have them scanned into your [facility] Records- EPIC  Please read over the following fact sheets you were given: IF YOU HAVE QUESTIONS ABOUT YOUR PRE-OP INSTRUCTIONS, PLEASE CALL [phone].     Pre-operative 5 CHG Bath Instructions   You can play a key role in reducing the risk of infection after surgery. Your skin needs to be as free of germs as possible. You can reduce the number of germs on your skin by washing with CHG (chlorhexidine gluconate) soap before surgery. CHG is an antiseptic soap that kills germs and continues to kill germs even after washing.   DO NOT use if you have an allergy to chlorhexidine/CHG or antibacterial soaps. If your skin becomes reddened or irritated, stop using the CHG and notify one of our RNs at [phone]  Please shower with the CHG soap starting 4 days before surgery using the following schedule: START SHOWERS ON   FRIDAY  May 15, 2023  Please keep in mind the following:  DO NOT shave, including legs and underarms, starting the day of  your first shower.   You may shave your face at any point before/day of surgery.   Place clean sheets on your bed the day you start using CHG soap. Use a clean washcloth (not used since being washed) for each shower. DO NOT sleep with pets once you start using the CHG.   CHG Shower Instructions:  If you choose to wash your hair and private area, wash first with your normal shampoo/soap.  After you use shampoo/soap, rinse your hair and body thoroughly to remove shampoo/soap residue.  Turn the water OFF and apply about 3 tablespoons (45 ml) of CHG soap to a CLEAN washcloth.  Apply CHG soap ONLY FROM YOUR NECK DOWN TO YOUR TOES (washing for 3-5 minutes)  DO NOT use CHG soap on face, private areas, open wounds, or sores.  Pay special attention to the area where your surgery is being performed.  If you are having back surgery, having someone wash your back for you may be helpful.  Wait 2 minutes after CHG soap is applied, then you may rinse off the CHG soap.  Pat dry with a clean towel  Put on clean clothes/pajamas   If you choose to wear lotion, please use ONLY the CHG-compatible lotions on the back of this paper.     Additional instructions for the day of surgery: DO NOT APPLY any lotions, deodorants, cologne, or perfumes.   Put on clean/comfortable clothes.  Brush your teeth.  Ask your nurse before applying any prescription medications to the skin.      CHG Compatible Lotions   Aveeno Moisturizing lotion  Cetaphil Moisturizing Cream  Cetaphil Moisturizing Lotion  Clairol Herbal Essence Moisturizing Lotion, Dry Skin  Clairol Herbal Essence Moisturizing Lotion, Extra Dry Skin  Clairol Herbal Essence Moisturizing Lotion, Normal Skin  Curel Age Defying Therapeutic Moisturizing Lotion with Alpha Hydroxy  Curel Extreme Care Body Lotion  Curel Soothing Hands Moisturizing Hand Lotion  Curel Therapeutic Moisturizing Cream, Fragrance-Free  Curel Therapeutic Moisturizing Lotion,  Fragrance-Free  Curel Therapeutic Moisturizing Lotion, Original Formula  Eucerin Daily Replenishing Lotion  Eucerin Dry Skin Therapy Plus Alpha Hydroxy Crme  Eucerin Dry Skin Therapy Plus Alpha Hydroxy Lotion  Eucerin Original Crme  Eucerin Original Lotion  Eucerin Plus Crme Eucerin Plus Lotion  Eucerin TriLipid Replenishing Lotion  Keri Anti-Bacterial Hand Lotion  Keri Deep Conditioning Original Lotion Dry Skin Formula Softly Scented  Keri Deep Conditioning Original Lotion, Fragrance Free Sensitive Skin Formula  Keri Lotion Fast Absorbing Fragrance Free Sensitive Skin Formula  Keri Lotion Fast Absorbing Softly Scented Dry Skin Formula  Keri Original Lotion  Keri Skin Renewal Lotion Keri Silky Smooth Lotion  Keri Silky Smooth Sensitive Skin Lotion  Nivea Body Creamy Conditioning Oil  Nivea Body Extra Enriched Lotion  Nivea Body Original Lotion  Nivea Body Sheer Moisturizing Lotion Nivea Crme  Nivea Skin Firming Lotion  NutraDerm 30 Skin Lotion  NutraDerm Skin Lotion  NutraDerm Therapeutic Skin Cream  NutraDerm Therapeutic Skin Lotion  ProShield Protective Hand Cream  Provon moisturizing lotion   FAILURE TO FOLLOW THESE INSTRUCTIONS MAY RESULT IN THE CANCELLATION OF YOUR SURGERY  PATIENT SIGNATURE_________________________________  NURSE SIGNATURE__________________________________  ________________________________________________________________________     Rogelia Mire    An incentive spirometer is a tool that can help keep your lungs clear and active. This tool measures how well you are filling your lungs with each breath. Taking long deep  breaths may help reverse or decrease the chance of developing breathing (pulmonary) problems (especially infection) following: A long period of time when you are unable to move or be active. BEFORE THE PROCEDURE  If the spirometer includes an indicator to show your best effort, your nurse or respiratory therapist will set  it to a desired goal. If possible, sit up straight or lean slightly forward. Try not to slouch. Hold the incentive spirometer in an upright position. INSTRUCTIONS FOR USE  Sit on the edge of your bed if possible, or sit up as far as you can in bed or on a chair. Hold the incentive spirometer in an upright position. Breathe out normally. Place the mouthpiece in your mouth and seal your lips tightly around it. Breathe in slowly and as deeply as possible, raising the piston or the ball toward the top of the column. Hold your breath for 3-5 seconds or for as long as possible. Allow the piston or ball to fall to the bottom of the column. Remove the mouthpiece from your mouth and breathe out normally. Rest for a few seconds and repeat Steps 1 through 7 at least 10 times every 1-2 hours when you are awake. Take your time and take a few normal breaths between deep breaths. The spirometer may include an indicator to show your best effort. Use the indicator as a goal to work toward during each repetition. After each set of 10 deep breaths, practice coughing to be sure your lungs are clear. If you have an incision (the cut made at the time of surgery), support your incision when coughing by placing a pillow or rolled up towels firmly against it. Once you are able to get out of bed, walk around indoors and cough well. You may stop using the incentive spirometer when instructed by your caregiver.  RISKS AND COMPLICATIONS Take your time so you do not get dizzy or light-headed. If you are in pain, you may need to take or ask for pain medication before doing incentive spirometry. It is harder to take a deep breath if you are having pain. AFTER USE Rest and breathe slowly and easily. It can be helpful to keep track of a log of your progress. Your caregiver can provide you with a simple table to help with this. If you are using the spirometer at home, follow these instructions: SEEK MEDICAL CARE IF:  You are  having difficultly using the spirometer. You have trouble using the spirometer as often as instructed. Your pain medication is not giving enough relief while using the spirometer. You develop fever of 100.5 F (38.1 C) or higher.                                                                                                    SEEK IMMEDIATE MEDICAL CARE IF:  You cough up bloody sputum that had not been present before. You develop fever of 102 F (38.9 C) or greater. You develop worsening pain at or near the incision site. MAKE SURE YOU:  Understand these instructions. Will watch your condition. Will get  help right away if you are not doing well or get worse. Document Released: 09/08/2006 Document Revised: 07/21/2011 Document Reviewed: 11/09/2006 [facility] Patient Information 2014 [place], [place].      WHAT IS A BLOOD TRANSFUSION? Blood Transfusion Information  A transfusion is the replacement of blood or some of its parts. Blood is made up of multiple cells which provide different functions. Red blood cells carry oxygen and are used for blood loss replacement. White blood cells fight against infection. Platelets control bleeding. Plasma helps clot blood. Other blood products are available for specialized needs, such as hemophilia or other clotting disorders. BEFORE THE TRANSFUSION  Who gives blood for transfusions?  Healthy volunteers who are fully evaluated to make sure their blood is safe. This is blood bank blood. Transfusion therapy is the safest it has ever been in the practice of medicine. Before blood is taken from a donor, a complete history is taken to make sure that person has no history of diseases nor engages in risky social behavior (examples are intravenous drug use or sexual activity with multiple partners). The donor's travel history is screened to minimize risk of transmitting infections, such as malaria. The donated blood is tested for signs of infectious diseases,  such as HIV and hepatitis. The blood is then tested to be sure it is compatible with you in order to minimize the chance of a transfusion reaction. If you or a relative donates blood, this is often done in anticipation of surgery and is not appropriate for emergency situations. It takes many days to process the donated blood. RISKS AND COMPLICATIONS Although transfusion therapy is very safe and saves many lives, the main dangers of transfusion include:  Getting an infectious disease. Developing a transfusion reaction. This is an allergic reaction to something in the blood you were given. Every precaution is taken to prevent this. The decision to have a blood transfusion has been considered carefully by your caregiver before blood is given. Blood is not given unless the benefits outweigh the risks. AFTER THE TRANSFUSION Right after receiving a blood transfusion, you will usually feel much better and more energetic. This is especially true if your red blood cells have gotten low (anemic). The transfusion raises the level of the red blood cells which carry oxygen, and this usually causes an energy increase. The nurse administering the transfusion will monitor you carefully for complications. HOME CARE INSTRUCTIONS  No special instructions are needed after a transfusion. You may find your energy is better. Speak with your caregiver about any limitations on activity for underlying diseases you may have. SEEK MEDICAL CARE IF:  Your condition is not improving after your transfusion. You develop redness or irritation at the intravenous (IV) site. SEEK IMMEDIATE MEDICAL CARE IF:  Any of the following symptoms occur over the next 12 hours: Shaking chills. You have a temperature by mouth above 102 F (38.9 C), not controlled by medicine. Chest, back, or muscle pain. People around you feel you are not acting correctly or are confused. Shortness of breath or difficulty breathing. Dizziness and  fainting. You get a rash or develop hives. You have a decrease in urine output. Your urine turns a dark color or changes to pink, red, or brown. Any of the following symptoms occur over the next 10 days: You have a temperature by mouth above 102 F (38.9 C), not controlled by medicine. Shortness of breath. Weakness after normal activity. The white part of the eye turns yellow (jaundice). You have a decrease in  the amount of urine or are urinating less often. Your urine turns a dark color or changes to pink, red, or brown. Document Released: 04/25/2000 Document Revised: 07/21/2011 Document Reviewed: 12/13/2007 [facility] Patient Information 2014 [place], [place].  _______________________________________________________________________

## 2023-05-07 NOTE — Progress Notes (Addendum)
COVID Vaccine received:  []  No [x]  Yes Date of any COVID positive Test in last 90 days:  February 11, 2023 was Rx'd Paxlovid.   PCP - Benedetto Goad, MD , Cherylann Parr, NP,  [phone] (Work) [phone] (Fax)  Cardiologist - None  Chest x-ray -  EKG -  will do at PST - hx Phentermine Stress Test -  ECHO -  Cardiac Cath -   PCR screen: [x]  Ordered & Completed []   No Order but Needs PROFEND     []   N/A for this surgery  Surgery Plan:  []  Ambulatory   []  Outpatient in bed  [x]  Admit Anesthesia:    []  General  [x]  Spinal  []   Choice []   MAC  Pacemaker / ICD device [x]  No []  Yes   Spinal Cord Stimulator:[x]  No []  Yes       History of Sleep Apnea? [x]  No []  Yes   CPAP used?- [x]  No []  Yes    Does the patient monitor blood sugar?   [x]  N/A   []  No []  Yes  Patient has: [x]  NO Hx DM   []  Pre-DM   []  DM1  []   DM2  Blood Thinner / Instructions:  none Aspirin Instructions:  none  ERAS Protocol Ordered: []  No  [x]  Yes PRE-SURGERY [x]  ENSURE  []  G2  Patient is to be NPO after: 1115  Dental hx: []  Dentures:  [x]  N/A      []  Bridge or Partial:  has Permanent bridge                  []  Loose or Damaged teeth:   Comments: Patient was given the 5 CHG shower / bath instructions for TKA revision surgery along with 2 bottles of the CHG soap. Patient will start this on: 05-15-23 All questions were asked and answered, Patient voiced understanding of this process.   Activity level: Patient is able to climb a flight of stairs without difficulty; [x]  No CP  [x]  No SOB, but would have Leg Pain.  Patient can perform ADLs without assistance.   Anesthesia review: PONV, CKD3, anxiety, Hx phentermine use. Hx MRSA  Patient denies shortness of breath, fever, cough and chest pain at PAT appointment.  Patient verbalized understanding and agreement to the Pre-Surgical Instructions that were given to them at this PAT appointment. Patient was also educated of the need to review these PAT instructions again prior  to her surgery.I reviewed the appropriate phone numbers to call if they have any and questions or concerns.

## 2023-05-08 ENCOUNTER — Encounter (HOSPITAL_COMMUNITY): Payer: Self-pay | Admitting: *Deleted

## 2023-05-08 ENCOUNTER — Encounter (HOSPITAL_COMMUNITY)
Admission: RE | Admit: 2023-05-08 | Discharge: 2023-05-08 | Disposition: A | Discharge status: Home / Self Care | Payer: Medicare PPO | Source: Ambulatory Visit | Attending: Orthopedic Surgery | Admitting: Orthopedic Surgery

## 2023-05-08 ENCOUNTER — Other Ambulatory Visit: Payer: Self-pay

## 2023-05-08 VITALS — BP 135/88 | Temp 98.6°F | Resp 16 | Ht 63.5 in | Wt 151.0 lb

## 2023-05-08 DIAGNOSIS — T505X5D Adverse effect of appetite depressants, subsequent encounter: Secondary | ICD-10-CM | POA: Diagnosis not present

## 2023-05-08 DIAGNOSIS — Z8614 Personal history of Methicillin resistant Staphylococcus aureus infection: Secondary | ICD-10-CM | POA: Diagnosis not present

## 2023-05-08 DIAGNOSIS — Z79899 Other long term (current) drug therapy: Secondary | ICD-10-CM | POA: Insufficient documentation

## 2023-05-08 DIAGNOSIS — Z01818 Encounter for other preprocedural examination: Secondary | ICD-10-CM | POA: Diagnosis present

## 2023-05-08 HISTORY — DX: Anxiety disorder, unspecified: F41.9

## 2023-05-08 HISTORY — DX: Chronic kidney disease, unspecified: N18.9

## 2023-05-08 HISTORY — DX: Essential (primary) hypertension: I10

## 2023-05-08 HISTORY — DX: Pneumonia, unspecified organism: J18.9

## 2023-05-08 HISTORY — DX: Thyrotoxicosis, unspecified without thyrotoxic crisis or storm: E05.90

## 2023-05-08 HISTORY — DX: Malignant (primary) neoplasm, unspecified: C80.1

## 2023-05-08 LAB — CBC
HCT: 43.9 % (ref 36.0–46.0)
Hemoglobin: 14.4 g/dL (ref 12.0–15.0)
MCH: 30.9 pg (ref 26.0–34.0)
MCHC: 32.8 g/dL (ref 30.0–36.0)
MCV: 94.2 fL (ref 80.0–100.0)
Platelets: 283 10*3/uL (ref 150–400)
RBC: 4.66 MIL/uL (ref 3.87–5.11)
RDW: 12.5 % (ref 11.5–15.5)
WBC: 10.6 10*3/uL — ABNORMAL HIGH (ref 4.0–10.5)
nRBC: 0 % (ref 0.0–0.2)

## 2023-05-08 LAB — COMPREHENSIVE METABOLIC PANEL
ALT: 23 U/L (ref 0–44)
AST: 17 U/L (ref 15–41)
Albumin: 4.4 g/dL (ref 3.5–5.0)
Alkaline Phosphatase: 38 U/L (ref 38–126)
Anion gap: 6 (ref 5–15)
BUN: 19 mg/dL (ref 8–23)
CO2: 26 mmol/L (ref 22–32)
Calcium: 9.3 mg/dL (ref 8.9–10.3)
Chloride: 101 mmol/L (ref 98–111)
Creatinine, Ser: 0.57 mg/dL (ref 0.44–1.00)
GFR, Estimated: >60 mL/min (ref >60)
Glucose, Bld: 93 mg/dL (ref 70–99)
Potassium: 4.5 mmol/L (ref 3.5–5.1)
Sodium: 133 mmol/L — ABNORMAL LOW (ref 135–145)
Total Bilirubin: 0.5 mg/dL (ref <1.2)
Total Protein: 7.2 g/dL (ref 6.5–8.1)

## 2023-05-08 LAB — SURGICAL PCR SCREEN
MRSA, PCR: POSITIVE — AB
Staphylococcus aureus: POSITIVE — AB

## 2023-05-11 NOTE — Progress Notes (Signed)
Patient's PCR screen is positive for BOTH MRSA and STAPH. Appropriate notes have been placed on the patient's chart. This note has been routed to Dr. Charlann Boxer and Rosalene Billings, PA for review. The Patient's surgery is currently scheduled for: 05-18-2022 at [facility].  Rudean Haskell, BSN, CVRN-BC   Pre-Surgical Testing Nurse [facility]  [phone]

## 2023-05-14 NOTE — H&P (Signed)
 TOTAL KNEE REVISION ADMISSION H&P  Patient is being admitted for left revision total knee arthroplasty.  Therapy Plans: outpatient therapy ? [facility] Disposition: Home with daughter Planned DVT Prophylaxis: aspirin  81mg  BID DME needed: none PCP: Dr. Tanda - clearance received  TXA: IV Allergies: NKDA Anesthesia Concerns: none BMI: 26.3 Last HgbA1c: Not diabetic   Other:  - Dealing with right thumb pain  - oxycodone , tizanidine , tylenol , celebrex   - ? left hip pain - some groin, some lateral hip > injected troch at H&P  - No home meds in the hospital >> Got charged previously    - Please don't leave sitting in recliner for long periods of time at hospital - bad experience with this last time   Subjective:  Chief Complaint:left knee pain.  HPI: Savannah Martinez, 68 y.o. female. She has a history of left primary total knee arthroplasty in 2023. She states that she had been recovering well early on after surgery until an incident in therapy where she made a full cycle on a stationary bicycle and since that time she has had pain. She is always felt that something occurred at that time however there was no clinical radiographic evidence to suggest otherwise. She had several second opinions with extensive workup including bone scan which indicated loosening. She ultimately decided to undergo revision total knee.   Patient Active Problem List   Diagnosis Date Noted   S/P total knee arthroplasty, left 05/14/2021   Past Medical History:  Diagnosis Date   Anxiety    Arthritis    Cancer (HCC)    skin cancers on face   Chronic kidney disease    Chronic pain    Headache    History of kidney stones    Hypertension    Hyperthyroidism    Pneumonia    PONV (postoperative nausea and vomiting)    just with general anesthesia    Past Surgical History:  Procedure Laterality Date   MOHS SURGERY     on face   PARTIAL KNEE ARTHROPLASTY Right 2017   TOTAL KNEE ARTHROPLASTY Left  05/14/2021   Procedure: TOTAL KNEE ARTHROPLASTY;  Surgeon: Ernie Cough, MD;  Location: [facility];  Service: Orthopedics;  Laterality: Left;   WISDOM TOOTH EXTRACTION      No current facility-administered medications for this encounter.   Current Outpatient Medications  Medication Sig Dispense Refill Last Dose/Taking   amphetamine -dextroamphetamine  (ADDERALL) 30 MG tablet Take 30 mg by mouth daily.   Taking   CALCIUM PO Take 1 tablet by mouth every 3 (three) days.   Taking   Cholecalciferol (VITAMIN D3 ULTRA STRENGTH) 125 MCG (5000 UT) capsule Take 5,000 Units by mouth daily.   Taking   DULoxetine  (CYMBALTA ) 30 MG capsule Take 30 mg by mouth daily.   Taking   ezetimibe  (ZETIA ) 10 MG tablet Take 10 mg by mouth every other day.   Taking   ibuprofen (ADVIL) 800 MG tablet Take 800 mg by mouth every 8 (eight) hours as needed for moderate pain (pain score 4-6).   Taking As Needed   levothyroxine  (SYNTHROID ) 50 MCG tablet Take 50 mcg by mouth daily before breakfast.   Taking   Lifitegrast (XIIDRA) 5 % SOLN Place 1 drop into both eyes in the morning and at bedtime.   Taking   LORazepam (ATIVAN) 0.5 MG tablet Take 0.5 mg by mouth at bedtime as needed for sleep.   Taking As Needed   MAGNESIUM CITRATE PO Take 1-2 tablets by mouth  daily.   Taking   Multiple Vitamin (MULTIVITAMIN WITH MINERALS) TABS tablet Take 1 tablet by mouth once a week.   Taking   Polyethyl Glycol-Propyl Glycol (SYSTANE OP) Place 1 drop into both eyes daily as needed (dry eyes).   Taking As Needed   Psyllium (METAMUCIL PO) Take 1 Scoop by mouth daily.   Taking   rizatriptan (MAXALT-MLT) 10 MG disintegrating tablet Take 10 mg by mouth 2 (two) times daily as needed for migraine.   Taking As Needed   rosuvastatin (CRESTOR) 5 MG tablet Take 5 mg by mouth daily.   Taking   liothyronine  (CYTOMEL ) 5 MCG tablet Take 5 mcg by mouth daily.      Allergies  Allergen Reactions   Nickel     Skin irritation    Pregabalin     Increased appetite    Trazodone     It made her feel weird.    Social History   Tobacco Use   Smoking status: Never   Smokeless tobacco: Never  Substance Use Topics   Alcohol  use: Yes    Comment: rare    No family history on file.    Review of Systems  Constitutional:  Negative for chills and fever.  Respiratory:  Negative for cough and shortness of breath.   Cardiovascular:  Negative for chest pain.  Gastrointestinal:  Negative for nausea and vomiting.  Musculoskeletal:  Positive for arthralgias.      Objective:  Physical Exam 68 year old female awake alert and oriented. She is in no acute distress. She walks in the office with a mild limp favoring her left knee but does not use an assist device on a regular basis.  Left knee exam: Her surgical incision is well-healed without signs of infection, no warmth erythema Full knee extension and flexion to 110 degrees with some terminal tightness Tenderness over the proximal tibia more the distal femur No lower extremity edema, erythema or calf tenderness   Vital signs in last 24 hours:    Labs:  Estimated body mass index is 26.33 kg/m as calculated from the following:   Height as of 05/08/23: 5' 3.5 (1.613 m).   Weight as of 05/08/23: 68.5 kg.  Imaging Review Imaging: Standing AP and lateral of the left knee were ordered and reviewed. I also was able to review these to past radiographs. She does have some evidence of bone resorption or lucency over the lateral proximal tibia as well as anteriorly on the lateral radiograph. No evidence of complications pertaining to the patella or femur.  Bone scan was performed in March 2024. The images and report were reviewed. The images are available on canopy. Findings included abnormal tracer uptake adjacent to the tibial component of the left knee both medially and laterally suspicious for aseptic loosening of the left knee.    Assessment/Plan:  Aseptic loosening, left knee(s) with failed  previous arthroplasty.   The patient history, physical examination, clinical judgment of the provider and imaging studies are consistent with end stage degenerative joint disease of the left knee(s), previous total knee arthroplasty. Revision total knee arthroplasty is deemed medically necessary. The treatment options including medical management, injection therapy, arthroscopy and revision arthroplasty were discussed at length. The risks and benefits of revision total knee arthroplasty were presented and reviewed. The risks due to aseptic loosening, infection, stiffness, patella tracking problems, thromboembolic complications and other imponderables were discussed. The patient acknowledged the explanation, agreed to proceed with the plan and consent was signed. Patient is being  admitted for inpatient treatment for surgery, pain control, PT, OT, prophylactic antibiotics, VTE prophylaxis, progressive ambulation and ADL's and discharge planning.The patient is planning to be discharged  home.  Rosina Calin, PA-C Orthopedic Surgery [facility] [place] [phone]

## 2023-05-19 ENCOUNTER — Inpatient Hospital Stay (HOSPITAL_COMMUNITY): Payer: Medicare PPO | Admitting: Registered Nurse

## 2023-05-19 ENCOUNTER — Other Ambulatory Visit: Payer: Self-pay

## 2023-05-19 ENCOUNTER — Inpatient Hospital Stay (HOSPITAL_COMMUNITY): Payer: Self-pay | Admitting: Registered Nurse

## 2023-05-19 ENCOUNTER — Encounter (HOSPITAL_COMMUNITY)
Admission: RE | Disposition: A | Discharge status: Home / Self Care | Payer: Self-pay | Source: Home / Self Care | Attending: Orthopedic Surgery

## 2023-05-19 ENCOUNTER — Inpatient Hospital Stay (HOSPITAL_COMMUNITY)
Admission: RE | Admit: 2023-05-19 | Discharge: 2023-05-20 | DRG: 468 | Disposition: A | Discharge status: Home / Self Care | Payer: Medicare PPO | Attending: Orthopedic Surgery | Admitting: Orthopedic Surgery

## 2023-05-19 ENCOUNTER — Encounter (HOSPITAL_COMMUNITY): Payer: Self-pay | Admitting: Orthopedic Surgery

## 2023-05-19 DIAGNOSIS — G8929 Other chronic pain: Secondary | ICD-10-CM | POA: Diagnosis present

## 2023-05-19 DIAGNOSIS — Z85828 Personal history of other malignant neoplasm of skin: Secondary | ICD-10-CM | POA: Diagnosis not present

## 2023-05-19 DIAGNOSIS — Y792 Prosthetic and other implants, materials and accessory orthopedic devices associated with adverse incidents: Secondary | ICD-10-CM | POA: Diagnosis present

## 2023-05-19 DIAGNOSIS — N189 Chronic kidney disease, unspecified: Secondary | ICD-10-CM | POA: Diagnosis present

## 2023-05-19 DIAGNOSIS — Z87442 Personal history of urinary calculi: Secondary | ICD-10-CM | POA: Diagnosis not present

## 2023-05-19 DIAGNOSIS — I129 Hypertensive chronic kidney disease with stage 1 through stage 4 chronic kidney disease, or unspecified chronic kidney disease: Secondary | ICD-10-CM | POA: Diagnosis present

## 2023-05-19 DIAGNOSIS — Z96652 Presence of left artificial knee joint: Principal | ICD-10-CM

## 2023-05-19 DIAGNOSIS — M1712 Unilateral primary osteoarthritis, left knee: Secondary | ICD-10-CM | POA: Diagnosis present

## 2023-05-19 DIAGNOSIS — T84033A Mechanical loosening of internal left knee prosthetic joint, initial encounter: Principal | ICD-10-CM | POA: Diagnosis present

## 2023-05-19 DIAGNOSIS — Z79899 Other long term (current) drug therapy: Secondary | ICD-10-CM

## 2023-05-19 DIAGNOSIS — T84093A Other mechanical complication of internal left knee prosthesis, initial encounter: Secondary | ICD-10-CM

## 2023-05-19 HISTORY — PX: TOTAL KNEE REVISION: SHX996

## 2023-05-19 LAB — TYPE AND SCREEN
ABO/RH(D): O POS
Antibody Screen: NEGATIVE

## 2023-05-19 SURGERY — TOTAL KNEE REVISION
Anesthesia: Spinal | Site: Knee | Laterality: Left

## 2023-05-19 MED ORDER — ONDANSETRON HCL 4 MG/2ML IJ SOLN: 4.0000 mg | Freq: Four times a day (QID) | INTRAMUSCULAR | Status: DC | PRN

## 2023-05-19 MED ORDER — ONDANSETRON HCL 4 MG/2ML IJ SOLN
INTRAMUSCULAR | Status: DC | PRN
  Administered 2023-05-19: 4 mg via INTRAVENOUS

## 2023-05-19 MED ORDER — ALUM & MAG HYDROXIDE-SIMETH 200-200-20 MG/5ML PO SUSP: 30.0000 mL | ORAL | Status: DC | PRN

## 2023-05-19 MED ORDER — BUPIVACAINE IN DEXTROSE 0.75-8.25 % IT SOLN
INTRATHECAL | Status: DC | PRN
  Administered 2023-05-19: 1.6 mL via INTRATHECAL

## 2023-05-19 MED ORDER — FENTANYL CITRATE (PF) 100 MCG/2ML IJ SOLN
INTRAMUSCULAR | Status: AC
  Filled 2023-05-19: qty 2

## 2023-05-19 MED ORDER — TRANEXAMIC ACID-NACL 1000-0.7 MG/100ML-% IV SOLN
1000.0000 mg | Freq: Once | INTRAVENOUS | Status: AC
  Administered 2023-05-19: 1000 mg via INTRAVENOUS
  Filled 2023-05-19: qty 100

## 2023-05-19 MED ORDER — FENTANYL CITRATE PF 50 MCG/ML IJ SOSY
100.0000 ug | PREFILLED_SYRINGE | Freq: Once | INTRAMUSCULAR | Status: AC
  Administered 2023-05-19: 50 ug via INTRAVENOUS
  Filled 2023-05-19: qty 2

## 2023-05-19 MED ORDER — OXYCODONE HCL 5 MG PO TABS
5.0000 mg | ORAL_TABLET | ORAL | Status: DC | PRN
  Administered 2023-05-19: 10 mg via ORAL
  Administered 2023-05-19: 5 mg via ORAL
  Administered 2023-05-19 – 2023-05-20 (×2): 10 mg via ORAL
  Filled 2023-05-19: qty 2
  Filled 2023-05-19: qty 1

## 2023-05-19 MED ORDER — BUPIVACAINE-EPINEPHRINE 0.25% -1:200000 IJ SOLN
INTRAMUSCULAR | Status: AC
  Filled 2023-05-19: qty 1

## 2023-05-19 MED ORDER — SODIUM CHLORIDE 0.9 % IR SOLN
Status: DC | PRN
  Administered 2023-05-19: 1000 mL

## 2023-05-19 MED ORDER — METOCLOPRAMIDE HCL 5 MG/ML IJ SOLN: 5.0000 mg | Freq: Three times a day (TID) | INTRAMUSCULAR | Status: DC | PRN

## 2023-05-19 MED ORDER — LACTATED RINGERS IV SOLN: INTRAVENOUS | Status: DC

## 2023-05-19 MED ORDER — 0.9 % SODIUM CHLORIDE (POUR BTL) OPTIME
TOPICAL | Status: DC | PRN
  Administered 2023-05-19: 1000 mL

## 2023-05-19 MED ORDER — SODIUM CHLORIDE (PF) 0.9 % IJ SOLN
INTRAMUSCULAR | Status: DC | PRN
  Administered 2023-05-19: 61 mL

## 2023-05-19 MED ORDER — POLYETHYLENE GLYCOL 3350 17 G PO PACK
17.0000 g | PACK | Freq: Two times a day (BID) | ORAL | Status: DC
  Administered 2023-05-19 – 2023-05-20 (×2): 17 g via ORAL
  Filled 2023-05-19 (×2): qty 1

## 2023-05-19 MED ORDER — MUPIROCIN 2 % EX OINT: 1.0000 | TOPICAL_OINTMENT | Freq: Two times a day (BID) | CUTANEOUS | 0 refills | Status: AC

## 2023-05-19 MED ORDER — FENTANYL CITRATE PF 50 MCG/ML IJ SOSY
25.0000 ug | PREFILLED_SYRINGE | INTRAMUSCULAR | Status: DC | PRN
  Administered 2023-05-19: 25 ug via INTRAVENOUS

## 2023-05-19 MED ORDER — PHENYLEPHRINE HCL-NACL 20-0.9 MG/250ML-% IV SOLN
INTRAVENOUS | Status: DC | PRN
  Administered 2023-05-19: 25 ug/min via INTRAVENOUS

## 2023-05-19 MED ORDER — METOCLOPRAMIDE HCL 5 MG PO TABS: 5.0000 mg | ORAL_TABLET | Freq: Three times a day (TID) | ORAL | Status: DC | PRN

## 2023-05-19 MED ORDER — SODIUM CHLORIDE 0.9% FLUSH: 3.0000 mL | Freq: Two times a day (BID) | INTRAVENOUS | Status: DC

## 2023-05-19 MED ORDER — OXYCODONE HCL 5 MG PO TABS
ORAL_TABLET | ORAL | Status: AC
  Filled 2023-05-19: qty 2

## 2023-05-19 MED ORDER — SENNA 8.6 MG PO TABS
2.0000 | ORAL_TABLET | Freq: Every day | ORAL | Status: DC
  Filled 2023-05-19: qty 2

## 2023-05-19 MED ORDER — MIDAZOLAM HCL 2 MG/2ML IJ SOLN
2.0000 mg | Freq: Once | INTRAMUSCULAR | Status: AC
  Administered 2023-05-19: 1 mg via INTRAVENOUS
  Filled 2023-05-19: qty 2

## 2023-05-19 MED ORDER — POVIDONE-IODINE 10 % EX SWAB: 2.0000 | Freq: Once | CUTANEOUS | Status: DC

## 2023-05-19 MED ORDER — SODIUM CHLORIDE 0.9% FLUSH: 10.0000 mL | Freq: Two times a day (BID) | INTRAVENOUS | Status: DC

## 2023-05-19 MED ORDER — PROPOFOL 1000 MG/100ML IV EMUL
INTRAVENOUS | Status: AC
  Filled 2023-05-19: qty 100

## 2023-05-19 MED ORDER — CEFAZOLIN SODIUM-DEXTROSE 2-4 GM/100ML-% IV SOLN
2.0000 g | INTRAVENOUS | Status: AC
  Administered 2023-05-19: 2 g via INTRAVENOUS
  Filled 2023-05-19: qty 100

## 2023-05-19 MED ORDER — STERILE WATER FOR INJECTION IJ SOLN
INTRAMUSCULAR | Status: DC | PRN
  Administered 2023-05-19: 1000 mL

## 2023-05-19 MED ORDER — OXYCODONE HCL 5 MG/5ML PO SOLN: 5.0000 mg | Freq: Once | ORAL | Status: AC | PRN

## 2023-05-19 MED ORDER — DEXAMETHASONE SODIUM PHOSPHATE 10 MG/ML IJ SOLN
INTRAMUSCULAR | Status: AC
  Filled 2023-05-19: qty 1

## 2023-05-19 MED ORDER — ACETAMINOPHEN 500 MG PO TABS
1000.0000 mg | ORAL_TABLET | Freq: Four times a day (QID) | ORAL | Status: DC
  Administered 2023-05-19 – 2023-05-20 (×3): 1000 mg via ORAL
  Filled 2023-05-19 (×2): qty 2

## 2023-05-19 MED ORDER — DIPHENHYDRAMINE HCL 12.5 MG/5ML PO ELIX: 12.5000 mg | ORAL_SOLUTION | ORAL | Status: DC | PRN

## 2023-05-19 MED ORDER — SODIUM CHLORIDE (PF) 0.9 % IJ SOLN
INTRAMUSCULAR | Status: AC
  Filled 2023-05-19: qty 30

## 2023-05-19 MED ORDER — SODIUM CHLORIDE 0.9% FLUSH: 3.0000 mL | INTRAVENOUS | Status: DC | PRN

## 2023-05-19 MED ORDER — VANCOMYCIN HCL IN DEXTROSE 1-5 GM/200ML-% IV SOLN
1000.0000 mg | INTRAVENOUS | Status: AC
  Administered 2023-05-19: 1000 mg via INTRAVENOUS
  Filled 2023-05-19: qty 200

## 2023-05-19 MED ORDER — HYDROMORPHONE HCL 1 MG/ML IJ SOLN
0.5000 mg | INTRAMUSCULAR | Status: DC | PRN
  Administered 2023-05-19: 1 mg via INTRAVENOUS
  Filled 2023-05-19: qty 1

## 2023-05-19 MED ORDER — TRANEXAMIC ACID-NACL 1000-0.7 MG/100ML-% IV SOLN
1000.0000 mg | INTRAVENOUS | Status: AC
  Administered 2023-05-19: 1000 mg via INTRAVENOUS
  Filled 2023-05-19: qty 100

## 2023-05-19 MED ORDER — EPHEDRINE SULFATE-NACL 50-0.9 MG/10ML-% IV SOSY
PREFILLED_SYRINGE | INTRAVENOUS | Status: DC | PRN
  Administered 2023-05-19: 5 mg via INTRAVENOUS

## 2023-05-19 MED ORDER — ACETAMINOPHEN 500 MG PO TABS
ORAL_TABLET | ORAL | Status: AC
  Filled 2023-05-19: qty 2

## 2023-05-19 MED ORDER — CHLORHEXIDINE GLUCONATE 4 % EX SOLN: 1.0000 | CUTANEOUS | 1 refills | Status: AC

## 2023-05-19 MED ORDER — ONDANSETRON HCL 4 MG/2ML IJ SOLN
INTRAMUSCULAR | Status: AC
  Filled 2023-05-19: qty 2

## 2023-05-19 MED ORDER — PHENOL 1.4 % MT LIQD: 1.0000 | OROMUCOSAL | Status: DC | PRN

## 2023-05-19 MED ORDER — BISACODYL 10 MG RE SUPP: 10.0000 mg | Freq: Every day | RECTAL | Status: DC | PRN

## 2023-05-19 MED ORDER — SUCCINYLCHOLINE CHLORIDE 200 MG/10ML IV SOSY
PREFILLED_SYRINGE | INTRAVENOUS | Status: DC | PRN
  Administered 2023-05-19: 120 mg via INTRAVENOUS

## 2023-05-19 MED ORDER — DEXAMETHASONE SODIUM PHOSPHATE 10 MG/ML IJ SOLN
8.0000 mg | Freq: Once | INTRAMUSCULAR | Status: AC
  Administered 2023-05-19: 8 mg via INTRAVENOUS

## 2023-05-19 MED ORDER — CEFAZOLIN SODIUM-DEXTROSE 2-4 GM/100ML-% IV SOLN
2.0000 g | Freq: Four times a day (QID) | INTRAVENOUS | Status: AC
  Administered 2023-05-19 (×2): 2 g via INTRAVENOUS
  Filled 2023-05-19 (×2): qty 100

## 2023-05-19 MED ORDER — ORAL CARE MOUTH RINSE: 15.0000 mL | Freq: Once | OROMUCOSAL | Status: AC

## 2023-05-19 MED ORDER — ONDANSETRON HCL 4 MG PO TABS: 4.0000 mg | ORAL_TABLET | Freq: Four times a day (QID) | ORAL | Status: DC | PRN

## 2023-05-19 MED ORDER — OXYCODONE HCL 5 MG PO TABS
5.0000 mg | ORAL_TABLET | Freq: Once | ORAL | Status: AC | PRN
Start: 2023-05-19 — End: 2023-05-19
  Administered 2023-05-19: 5 mg via ORAL

## 2023-05-19 MED ORDER — OXYCODONE HCL 5 MG PO TABS
10.0000 mg | ORAL_TABLET | ORAL | Status: DC | PRN
  Administered 2023-05-20: 10 mg via ORAL
  Filled 2023-05-19 (×2): qty 2

## 2023-05-19 MED ORDER — ASPIRIN 81 MG PO CHEW
81.0000 mg | CHEWABLE_TABLET | Freq: Two times a day (BID) | ORAL | Status: DC
  Administered 2023-05-19 – 2023-05-20 (×2): 81 mg via ORAL
  Filled 2023-05-19 (×2): qty 1

## 2023-05-19 MED ORDER — CELECOXIB 200 MG PO CAPS
200.0000 mg | ORAL_CAPSULE | Freq: Two times a day (BID) | ORAL | Status: DC
Start: 2023-05-19 — End: 2023-05-20
  Administered 2023-05-19: 200 mg via ORAL
  Filled 2023-05-19: qty 1

## 2023-05-19 MED ORDER — MENTHOL 3 MG MT LOZG: 1.0000 | LOZENGE | OROMUCOSAL | Status: DC | PRN

## 2023-05-19 MED ORDER — OXYCODONE HCL 5 MG PO TABS
ORAL_TABLET | ORAL | Status: AC
  Filled 2023-05-19: qty 1

## 2023-05-19 MED ORDER — CHLORHEXIDINE GLUCONATE 0.12 % MT SOLN
15.0000 mL | Freq: Once | OROMUCOSAL | Status: AC
  Administered 2023-05-19: 15 mL via OROMUCOSAL

## 2023-05-19 MED ORDER — DEXAMETHASONE SODIUM PHOSPHATE 10 MG/ML IJ SOLN
10.0000 mg | Freq: Once | INTRAMUSCULAR | Status: AC
Start: 2023-05-20 — End: 2023-05-20
  Administered 2023-05-20: 10 mg via INTRAVENOUS
  Filled 2023-05-19: qty 1

## 2023-05-19 MED ORDER — FENTANYL CITRATE (PF) 100 MCG/2ML IJ SOLN
INTRAMUSCULAR | Status: DC | PRN
  Administered 2023-05-19 (×2): 25 ug via INTRAVENOUS

## 2023-05-19 MED ORDER — PROPOFOL 500 MG/50ML IV EMUL
INTRAVENOUS | Status: DC | PRN
  Administered 2023-05-19: 50 ug/kg/min via INTRAVENOUS
  Administered 2023-05-19: 100 mg via INTRAVENOUS

## 2023-05-19 MED ORDER — FENTANYL CITRATE PF 50 MCG/ML IJ SOSY
PREFILLED_SYRINGE | INTRAMUSCULAR | Status: AC
  Filled 2023-05-19: qty 1

## 2023-05-19 MED ORDER — KETOROLAC TROMETHAMINE 30 MG/ML IJ SOLN
INTRAMUSCULAR | Status: AC
  Filled 2023-05-19: qty 1

## 2023-05-19 MED ORDER — ACETAMINOPHEN 500 MG PO TABS
1000.0000 mg | ORAL_TABLET | Freq: Once | ORAL | Status: AC
  Administered 2023-05-19: 1000 mg via ORAL

## 2023-05-19 MED ORDER — ROPIVACAINE HCL 7.5 MG/ML IJ SOLN
INTRAMUSCULAR | Status: DC | PRN
  Administered 2023-05-19: 20 mL via PERINEURAL

## 2023-05-19 MED ORDER — ONDANSETRON HCL 4 MG/2ML IJ SOLN: 4.0000 mg | Freq: Once | INTRAMUSCULAR | Status: DC | PRN

## 2023-05-19 MED ORDER — PROPOFOL 500 MG/50ML IV EMUL
INTRAVENOUS | Status: AC
  Filled 2023-05-19: qty 50

## 2023-05-19 MED ORDER — MUPIROCIN 2 % EX OINT
TOPICAL_OINTMENT | Freq: Two times a day (BID) | CUTANEOUS | Status: DC
Start: 2023-05-19 — End: 2023-05-20
  Administered 2023-05-19: 1 via NASAL
  Filled 2023-05-19: qty 22

## 2023-05-19 MED ORDER — TIZANIDINE HCL 4 MG PO TABS: 2.0000 mg | ORAL_TABLET | Freq: Three times a day (TID) | ORAL | Status: DC | PRN

## 2023-05-19 SURGICAL SUPPLY — 55 items
ATTUNE PSRP INSR SZ4 5 KNEE (Insert) IMPLANT
BAG COUNTER SPONGE SURGICOUNT (BAG) IMPLANT
BAG DECANTER FOR FLEXI CONT (MISCELLANEOUS) IMPLANT
BAG ZIPLOCK 12X15 (MISCELLANEOUS) IMPLANT
BLADE SAW SGTL 11.0X1.19X90.0M (BLADE) IMPLANT
BLADE SAW SGTL 13.0X1.19X90.0M (BLADE) ×1 IMPLANT
BLADE SAW SGTL 81X20 HD (BLADE) ×1 IMPLANT
BNDG ELASTIC 6INX 5YD STR LF (GAUZE/BANDAGES/DRESSINGS) ×1 IMPLANT
BOWL SMART MIX CTS (DISPOSABLE) IMPLANT
BRUSH FEMORAL CANAL (MISCELLANEOUS) IMPLANT
CEMENT HV SMART SET (Cement) IMPLANT
CEMENT RESTRICTOR DEPUY SZ 4 (Cement) IMPLANT
COVER SURGICAL LIGHT HANDLE (MISCELLANEOUS) ×1 IMPLANT
CUFF TRNQT CYL 34X4.125X (TOURNIQUET CUFF) ×1 IMPLANT
DERMABOND ADVANCED .7 DNX12 (GAUZE/BANDAGES/DRESSINGS) ×1 IMPLANT
DRAPE INCISE IOBAN 66X45 STRL (DRAPES) ×1 IMPLANT
DRAPE U-SHAPE 47X51 STRL (DRAPES) ×1 IMPLANT
DRESSING AQUACEL AG SP 3.5X10 (GAUZE/BANDAGES/DRESSINGS) IMPLANT
DRSG AQUACEL AG ADV 3.5X10 (GAUZE/BANDAGES/DRESSINGS) ×1 IMPLANT
DRSG AQUACEL AG SP 3.5X10 (GAUZE/BANDAGES/DRESSINGS) ×1
DURAPREP 26ML APPLICATOR (WOUND CARE) ×2 IMPLANT
ELECT REM PT RETURN 15FT ADLT (MISCELLANEOUS) ×1 IMPLANT
GAUZE SPONGE 2X2 8PLY STRL LF (GAUZE/BANDAGES/DRESSINGS) IMPLANT
GLOVE BIO SURGEON STRL SZ 6 (GLOVE) ×1 IMPLANT
GLOVE BIOGEL PI IND STRL 6.5 (GLOVE) ×1 IMPLANT
GLOVE BIOGEL PI IND STRL 7.5 (GLOVE) ×1 IMPLANT
GLOVE ORTHO TXT STRL SZ7.5 (GLOVE) ×2 IMPLANT
GOWN STRL REUS W/ TWL LRG LVL3 (GOWN DISPOSABLE) ×2 IMPLANT
HOLDER FOLEY CATH W/STRAP (MISCELLANEOUS) IMPLANT
INSERT TIB CMT ATTUNE RP SZ3 (Knees) IMPLANT
KIT TURNOVER KIT A (KITS) IMPLANT
MANIFOLD NEPTUNE II (INSTRUMENTS) ×1 IMPLANT
NDL SAFETY ECLIPSE 18X1.5 (NEEDLE) ×1 IMPLANT
NS IRRIG 1000ML POUR BTL (IV SOLUTION) ×1 IMPLANT
PACK TOTAL KNEE CUSTOM (KITS) ×1 IMPLANT
PIN FIX SIGMA LCS THRD HI (PIN) IMPLANT
PROTECTOR NERVE ULNAR (MISCELLANEOUS) ×1 IMPLANT
SET HNDPC FAN SPRY TIP SCT (DISPOSABLE) ×1 IMPLANT
SET PAD KNEE POSITIONER (MISCELLANEOUS) ×1 IMPLANT
SLEEVE KNEE ATTUNE 29MM (Knees) IMPLANT
SOLUTION IRRIG SURGIPHOR (IV SOLUTION) IMPLANT
SPIKE FLUID TRANSFER (MISCELLANEOUS) IMPLANT
STAPLER SKIN PROX WIDE 3.9 (STAPLE) IMPLANT
STEM CEMT ATTUNE 14X80 (Knees) IMPLANT
SUT MNCRL AB 3-0 PS2 18 (SUTURE) ×1 IMPLANT
SUT STRATAFIX PDS+ 0 24IN (SUTURE) ×1 IMPLANT
SUT VIC AB 1 CT1 36 (SUTURE) ×1 IMPLANT
SUT VIC AB 2-0 CT1 TAPERPNT 27 (SUTURE) ×3 IMPLANT
SYR 3ML LL SCALE MARK (SYRINGE) ×1 IMPLANT
TOWER CARTRIDGE SMART MIX (DISPOSABLE) IMPLANT
TRAY FOLEY MTR SLVR 16FR STAT (SET/KITS/TRAYS/PACK) ×1 IMPLANT
TUBE KAMVAC SUCTION (TUBING) IMPLANT
TUBE SUCTION HIGH CAP CLEAR NV (SUCTIONS) ×1 IMPLANT
WATER STERILE IRR 1000ML POUR (IV SOLUTION) ×1 IMPLANT
WRAP KNEE MAXI GEL POST OP (GAUZE/BANDAGES/DRESSINGS) ×1 IMPLANT

## 2023-05-19 NOTE — Evaluation (Signed)
 Physical Therapy Evaluation Patient Details Name: Savannah Martinez MRN: [id number] DOB: February 23, 1956 Today's Date: 05/19/2023  History of Present Illness  Pt 68 yo female s/p L TKA revision 05/19/23.  She has hx including arthritis, chronic pain, R partial knee replacement, HTN, L TKA 05/14/21  Clinical Impression  Pt is s/p TKA revisionresulting in the deficits listed below (see PT Problem List). At baseline, pt was independent and enjoys walking her dog. Today, pt with excellent ROM, quad activation, and pain control.  She was able to ambulate 29' with RW and CGA.  Does need cues for [facility] with ambulation.  Pt expected to progress well with therapy.  Pt will benefit from acute skilled PT to increase their independence and safety with mobility to allow discharge.          If plan is discharge home, recommend the following: A little help with walking and/or transfers;A little help with bathing/dressing/bathroom;Assistance with cooking/housework;Help with stairs or ramp for entrance   Can travel by private vehicle        Equipment Recommendations None recommended by PT  Recommendations for Other Services       Functional Status Assessment Patient has had a recent decline in their functional status and demonstrates the ability to make significant improvements in function in a reasonable and predictable amount of time.     Precautions / Restrictions Precautions Precautions: Fall;Knee Restrictions Weight Bearing Restrictions Per Provider Order: Yes LLE Weight Bearing Per Provider Order: Partial weight bearing LLE Partial Weight Bearing Percentage or Pounds: 50%      Mobility  Bed Mobility Overal bed mobility: Needs Assistance Bed Mobility: Supine to Sit, Sit to Supine     Supine to sit: Supervision Sit to supine: Supervision        Transfers Overall transfer level: Needs assistance Equipment used: Rolling walker (2 wheels) Transfers: Sit to/from Stand Sit to Stand: Contact guard  assist           General transfer comment: cues for [facility]    Ambulation/Gait Ambulation/Gait assistance: Contact guard assist Gait Distance (Feet): 80 Feet Assistive device: Rolling walker (2 wheels) Gait Pattern/deviations: Step-to pattern, Decreased stride length, Decreased weight shift to left Gait velocity: decreased     General Gait Details: Education/cues for sequencing and step to pattern in order to maintain [facility]  Stairs            Wheelchair Mobility     Tilt Bed    Modified Rankin (Stroke Patients Only)       Balance Overall balance assessment: Needs assistance Sitting-balance support: No upper extremity supported Sitting balance-Leahy Scale: Good     Standing balance support: Bilateral upper extremity supported, No upper extremity supported Standing balance-Leahy Scale: Fair Standing balance comment: could static stand without UE support; RW to ambulate ; steady wtih RW                             Pertinent Vitals/Pain Pain Assessment Pain Assessment: 0-10 Pain Score: 3  Pain Location: L knee Pain Descriptors / Indicators: Discomfort Pain Intervention(s): Limited activity within patient's tolerance, Monitored during session, Premedicated before session, Repositioned    Home Living Family/patient expects to be discharged to:: Private residence Living Arrangements: Alone Available Help at Discharge: Family;Available 24 hours/day (dtr staying with her) Type of Home: House Home Access: Stairs to enter Entrance Stairs-Rails: Left Entrance Stairs-Number of Steps: 2   Home Layout: One level;Laundry or work area in  basement Home Equipment: Tub bench;Rolling Walker (2 wheels);Cane - single point      Prior Function Prior Level of Function : Independent/Modified Independent;Driving             Mobility Comments: Could ambulate in community without AD; likes to walk ADLs Comments: independent with adls and iadls      Extremity/Trunk Assessment   Upper Extremity Assessment Upper Extremity Assessment: Overall WFL for tasks assessed    Lower Extremity Assessment Lower Extremity Assessment: LLE deficits/detail;RLE deficits/detail RLE Deficits / Details: ROM WFL; MMT 5/5 LLE Deficits / Details: Expected post op changes; ROM: ~5 to 80 degrees; MMT: ankle 5/5, knee and hip 3/5 not further tested; reports still decreased sensation in L foot/buttock but able to SLR without difficulty or ext lag    Cervical / Trunk Assessment Cervical / Trunk Assessment: Normal  Communication      Cognition Arousal: Alert Behavior During Therapy: WFL for tasks assessed/performed Overall Cognitive Status: Within Functional Limits for tasks assessed                                          General Comments General comments (skin integrity, edema, etc.): VSS; pt likes to sleep with legs elevated - educated ok for pillows under lower legs but not knee and knee to stay straight -elevated on 2 pillows    Exercises     Assessment/Plan    PT Assessment Patient needs continued PT services  PT Problem List Decreased strength;Pain;Decreased range of motion;Decreased activity tolerance;Decreased balance;Decreased mobility;Decreased knowledge of precautions;Decreased knowledge of use of DME       PT Treatment Interventions DME instruction;Therapeutic exercise;Gait training;Stair training;Functional mobility training;Therapeutic activities;Patient/family education;Modalities;Balance training    PT Goals (Current goals can be found in the Care Plan section)  Acute Rehab PT Goals Patient Stated Goal: return home PT Goal Formulation: With patient/family Time For Goal Achievement: 06/02/23 Potential to Achieve Goals: Good    Frequency 7X/week     Co-evaluation               AM-PAC PT 6 Clicks Mobility  Outcome Measure Help needed turning from your back to your side while in a flat bed without  using bedrails?: A Little Help needed moving from lying on your back to sitting on the side of a flat bed without using bedrails?: A Little Help needed moving to and from a bed to a chair (including a wheelchair)?: A Little Help needed standing up from a chair using your arms (e.g., wheelchair or bedside chair)?: A Little Help needed to walk in hospital room?: A Little Help needed climbing 3-5 steps with a railing? : A Little 6 Click Score: 18    End of Session Equipment Utilized During Treatment: Gait belt Activity Tolerance: Patient tolerated treatment well Patient left: in bed;with call bell/phone within reach;with bed alarm set;with SCD's reapplied Nurse Communication: Mobility status PT Visit Diagnosis: Other abnormalities of gait and mobility (R26.89);Muscle weakness (generalized) (M62.81)    Time: 8341-8266 PT Time Calculation (min) (ACUTE ONLY): 35 min   Charges:   PT Evaluation $PT Eval Low Complexity: 1 Low PT Treatments $Gait Training: 8-22 mins PT General Charges $$ ACUTE PT VISIT: 1 Visit         Benjiman, PT Acute Rehab [facility] [phone]   Benjiman VEAR [place] 05/19/2023, 5:48 PM

## 2023-05-19 NOTE — Anesthesia Preprocedure Evaluation (Addendum)
 Anesthesia Evaluation  Patient identified by MRN, date of birth, ID band Patient awake    Reviewed: Allergy & Precautions, NPO status , Patient's Chart, lab work & pertinent test results  History of Anesthesia Complications (+) PONV and history of anesthetic complications  Airway Mallampati: II  TM Distance: >3 FB Neck ROM: Full    Dental  (+) Dental Advisory Given   Pulmonary neg pulmonary ROS   Pulmonary exam normal        Cardiovascular hypertension, Normal cardiovascular exam     Neuro/Psych  Headaches PSYCHIATRIC DISORDERS Anxiety        GI/Hepatic negative GI ROS, Neg liver ROS,,,  Endo/Other  negative endocrine ROS    Renal/GU CRFRenal disease     Musculoskeletal  (+) Arthritis ,    Abdominal   Peds  Hematology negative hematology ROS (+)  Plt 283k    Anesthesia Other Findings   Reproductive/Obstetrics                             Anesthesia Physical Anesthesia Plan  ASA: 3  Anesthesia Plan: Spinal   Post-op Pain Management: Tylenol  PO (pre-op)* and Regional block*   Induction:   PONV Risk Score and Plan: 3 and Treatment may vary due to age or medical condition and Propofol  infusion  Airway Management Planned: Natural Airway and Simple Face Mask  Additional Equipment: None  Intra-op Plan:   Post-operative Plan:   Informed Consent: I have reviewed the patients History and Physical, chart, labs and discussed the procedure including the risks, benefits and alternatives for the proposed anesthesia with the patient or authorized representative who has indicated his/her understanding and acceptance.       Plan Discussed with: CRNA and Anesthesiologist  Anesthesia Plan Comments: (Labs reviewed, platelets acceptable. Discussed risks and benefits of spinal, including spinal/epidural hematoma, infection, failed block, and PDPH. Patient expressed understanding and wished to  proceed. )        Anesthesia Quick Evaluation

## 2023-05-19 NOTE — Anesthesia Procedure Notes (Signed)
 Spinal  Patient location during procedure: OR Start time: 05/19/2023 10:07 AM End time: 05/19/2023 10:09 AM Reason for block: surgical anesthesia Staffing Performed: anesthesiologist  Anesthesiologist: Lucious Debby BRAVO, MD Performed by: Lucious Debby BRAVO, MD Authorized by: Lucious Debby BRAVO, MD   Preanesthetic Checklist Completed: patient identified, IV checked, risks and benefits discussed, surgical consent, monitors and equipment checked, pre-op evaluation and timeout performed Spinal Block Patient position: sitting Prep: DuraPrep Patient monitoring: heart rate, cardiac monitor, continuous pulse ox and blood pressure Approach: midline Location: L3-4 Injection technique: single-shot Needle Needle type: Pencan  Needle gauge: 24 G Additional Notes Consent was obtained prior to the procedure with all questions answered and concerns addressed. Risks including, but not limited to, bleeding, infection, nerve damage, paralysis, failed block, inadequate analgesia, allergic reaction, high spinal, itching, and headache were discussed and the patient wished to proceed. Functioning IV was confirmed and monitors were applied. Sterile prep and drape, including hand hygiene, mask, and sterile gloves were used. The patient was positioned and the spine was prepped. The skin was anesthetized with lidocaine . Free flow of clear CSF was obtained prior to injecting local anesthetic into the CSF. The spinal needle aspirated freely following injection. The needle was carefully withdrawn. The patient tolerated the procedure well.   Debby Lucious, MD

## 2023-05-19 NOTE — Anesthesia Procedure Notes (Signed)
 Procedure Name: Intubation Date/Time: 05/19/2023 10:49 AM  Performed by: Lucious Debby BRAVO, MDPre-anesthesia Checklist: Patient identified, Emergency Drugs available, Suction available and Patient being monitored Patient Re-evaluated:Patient Re-evaluated prior to induction Oxygen Delivery Method: Circle system utilized Preoxygenation: Pre-oxygenation with 100% oxygen Induction Type: IV induction Ventilation: Mask ventilation without difficulty Laryngoscope Size: Miller and 2 Grade View: Grade II Tube type: Oral Tube size: 7.0 mm Number of attempts: 1 Airway Equipment and Method: Stylet and Oral airway Placement Confirmation: ETT inserted through vocal cords under direct vision, positive ETCO2 and breath sounds checked- equal and bilateral Secured at: 22 cm Tube secured with: Tape Dental Injury: Teeth and Oropharynx as per pre-operative assessment  Comments: Grade 2a view

## 2023-05-19 NOTE — Anesthesia Postprocedure Evaluation (Signed)
 Anesthesia Post Note  Patient: Savannah Martinez  Procedure(s) Performed: TOTAL KNEE REVISION (Left: Knee)     Patient location during evaluation: PACU Anesthesia Type: Spinal and General Level of consciousness: awake and alert Pain management: pain level controlled Vital Signs Assessment: post-procedure vital signs reviewed and stable Respiratory status: spontaneous breathing and respiratory function stable Cardiovascular status: blood pressure returned to baseline and stable Postop Assessment: spinal receding and no apparent nausea or vomiting Anesthetic complications: no Comments: Successful spinal. Converted to GA intra-op due to laryngospasm during MAC.    No notable events documented.  Last Vitals:  Vitals:   05/19/23 1400 05/19/23 1430  BP: (!) 132/90 108/61  Pulse: 89 88  Resp: 14 17  Temp:    SpO2: 97% 97%                  Debby FORBES Like

## 2023-05-19 NOTE — Anesthesia Procedure Notes (Addendum)
 Anesthesia Regional Block: Adductor canal block   Pre-Anesthetic Checklist: , timeout performed,  Correct Patient, Correct Site, Correct Laterality,  Correct Procedure, Correct Position, site marked,  Risks and benefits discussed,  Surgical consent,  Pre-op evaluation,  At surgeon's request and post-op pain management  Laterality: Left  Prep: chloraprep       Needles:  Injection technique: Single-shot  Needle Type: Echogenic Needle     Needle Length: 10cm  Needle Gauge: 21     Additional Needles:   Narrative:  Start time: 05/19/2023 9:26 AM End time: 05/19/2023 9:29 AM Injection made incrementally with aspirations every 5 mL.  Performed by: Personally  Anesthesiologist: Lucious Debby BRAVO, MD  Additional Notes: No pain on injection. No increased resistance to injection. Injection made in 5cc increments. Good needle visualization. Patient tolerated the procedure well.

## 2023-05-19 NOTE — Op Note (Signed)
 Savannah Martinez, HEFNER MEDICAL RECORD NO: [id number] ACCOUNT NO: [id number] DATE OF BIRTH: 01/15/1956 FACILITY: [facility] LOCATION: [facility] PHYSICIAN: Donnice BIRCH. Ernie, MD  Operative Report   DATE OF PROCEDURE: 05/19/2023  PREOPERATIVE DIAGNOSIS:  Aseptic loosening left total knee replacement, tibial component.  POSTOPERATIVE DIAGNOSIS:  Aseptic loosening left total knee replacement, tibial component.  PROCEDURE:  Revision left total knee arthroplasty, tibial revision with polyethylene.  COMPONENTS USED:  Depuy Attune revision knee system with a size 3 revision tibial baseplate with a 29 mm press-fit sleeve with a 14 x 80 cemented stem.  We used a size 5 mm insert to match the size of previously placed 4 femur.  SURGEON:  Donnice BIRCH. Ernie, MD.  ASSISTANT:  Rosina Calin, PA-C.  Note that Ms. Calin was present from the entirety of the case from preoperative positioning, preoperative management of the operative extremity, general facilitation of the case and primary wound closure.  ANESTHESIA:  Spinal plus general.  BLOOD LOSS:  Less than 100 mL.  TOURNIQUET:  Tourniquet was up for 35 minutes at 225 mmHg.  INDICATIONS FOR THE PROCEDURE:  The patient is a 68 year old female who has had her left hip replaced approximately 2 years ago.  She has reported concerns regarding left knee pain from an early time period.  Radiographs did not indicate obvious concern  for loosening.  Subsequent bone scans had raised concerns regarding aseptic loosening of the proximal tibia.  Most recent radiographs had indicated an increased lucent line beneath the tray over the medial and lateral aspect of the knee.  Given her  ongoing challenges with pain, the radiographic findings included the bone scan findings, we discussed proceeding with revision knee surgery.  Risks of infection, DVT, component failure, stiffness, and need for future surgeries were discussed and reviewed  in the setting of revision surgery.   Consent was obtained for the benefit of pain relief.  DESCRIPTION OF PROCEDURE:  The patient was brought to the operative theater.  Once adequate anesthesia, preoperative antibiotics, Ancef  administered as well as tranexamic acid  and Decadron .  She was positioned supine with a left thigh tourniquet placed.   The left lower extremity was then prepped and draped in a sterile fashion.  A timeout was performed identifying the patient, planned procedure and extremity.  Her leg was exsanguinated and the tourniquet elevated to 225 mmHg.  Her old incision was  marked on the skin and extended slightly proximal and distal for revision exposure.  The incision was made and soft tissue exposure obtained.  A median arthrotomy was made encountering clear synovial fluid, no signs of inflammation or infection.   Following initial exposure of the medial and lateral gutters as well as the parapatellar space and suprapatellar pouch, the knee was flexed with the patella subluxated laterally.  I then removed the polyethylene insert.  At this point, I first evaluated  the tibial component and found that I was able to elevate this.  It appeared that there was debonding of the cement to bone interface causing the loosening as opposed to cement and metal implant interface debonding.  The tibial component was removed.  I  removed the remaining cement from the metaphysis of the tibia, which remained intact and well connected to her bone.  Once this was done, we prepared for revision components.  I freshened up the proximal tibia just to remove old soft tissue and remaining  bone and cement.  I then reamed up to 14 mm.  I then broached to  a 29 broach, which sat proud of the proximal tibia as to be expected as her old insert was a size 8.  I then placed a size 3 tibial base plate on the broach and we did a trial reduction.   With this, I found that the knee came to full extension and had good flexion with balanced medial and lateral  collateral ligaments with a well-tracking patella with a size 5 insert.  Given these findings, we removed all the trial components.  I placed a  cement restrictor deep.  The final components were opened and configured on the back table under direct supervision.  Once the cement restrictor was placed, we irrigated the knee with normal saline solution with pulse lavage.  I injected the posterior  capsule medially with 0.25% Marcaine  with epinephrine  and 1 mL of Toradol  and saline.  This was done carefully with aspirations to make certain there was no blood aspirated.  Following the irrigation, cement was mixed.  I placed cement into the proximal  tibia.  We then impacted the final tibial component into place and it sat at the level where we had the broach.  Based on this and following a repeat trial reduction with a size 5 insert, we opened up a 5 posterior stabilized insert.  It was placed in  the tibial tray and the knee was reduced.  The tourniquet was let down after 35 minutes.  There was no significant hemostasis required.  The knee was irrigated with normal saline solution.  At this point, the knee was flexed and the extensor mechanism  was reapproximated using #1 Vicryl and #1 Stratafix suture.  The remainder of the wound was closed in layers with 2-0 Vicryl and a running Monocryl stitch.  The wound was cleaned, dried, and dressed sterilely using surgical glue and Aquacel dressing.   The patient was brought to the recovery room in stable condition, tolerating the procedure well.   CHR D: 05/19/2023 11:31:30 am T: 05/19/2023 11:44:00 am  JOB: [id number]/ [id number]

## 2023-05-19 NOTE — Brief Op Note (Signed)
 05/19/2023  11:23 AM  PATIENT:  Savannah Martinez  68 y.o. female  PRE-OPERATIVE DIAGNOSIS:  Aseptic failure left total knee replacement, tibial component  POST-OPERATIVE DIAGNOSIS:  Aseptic failure left total knee replacement, tibial component  PROCEDURE:  Procedure(s): TOTAL KNEE REVISION (Left)  SURGEON:  Surgeons and Role:    DEWAINE Ernie Cough, MD - Primary  PHYSICIAN ASSISTANT: Rosina Calin, PA-C  ANESTHESIA:   spinal and general  EBL:  50 mL   BLOOD ADMINISTERED:none  DRAINS: none   LOCAL MEDICATIONS USED:  MARCAINE      SPECIMEN:  No Specimen  DISPOSITION OF SPECIMEN:  N/A  COUNTS:  YES  TOURNIQUET:   Total Tourniquet Time Documented: Thigh (Left) - 35 minutes Total: Thigh (Left) - 35 minutes   DICTATION: .Other Dictation: Dictation Number [id number]  PLAN OF CARE: Admit to inpatient   PATIENT DISPOSITION:  PACU - hemodynamically stable.   Delay start of Pharmacological VTE agent (>24hrs) due to surgical blood loss or risk of bleeding: no

## 2023-05-19 NOTE — Transfer of Care (Signed)
 Immediate Anesthesia Transfer of Care Note  Patient: Savannah Martinez  Procedure(s) Performed: TOTAL KNEE REVISION (Left: Knee)  Patient Location: PACU  Anesthesia Type:General  Level of Consciousness: awake, alert , oriented, and patient cooperative  Airway & Oxygen Therapy: Patient Spontanous Breathing and Patient connected to face mask oxygen  Post-op Assessment: Report given to RN and Post -op Vital signs reviewed and stable  Post vital signs: Reviewed and stable  Last Vitals:  Vitals Value Taken Time  BP 138/88 05/19/23 1152  Temp    Pulse 104 05/19/23 1155  Resp 18 05/19/23 1155  SpO2 100 % 05/19/23 1155  Vitals shown include unfiled device data.  Last Pain:  Vitals:   05/19/23 0945  TempSrc:   PainSc: 0-No pain         Complications: No notable events documented.

## 2023-05-19 NOTE — Anesthesia Procedure Notes (Signed)
 Procedure Name: MAC Date/Time: 05/19/2023 10:03 AM  Performed by: Memory Armida LABOR, CRNAPre-anesthesia Checklist: Patient identified, Emergency Drugs available, Suction available, Patient being monitored and Timeout performed Patient Re-evaluated:Patient Re-evaluated prior to induction Oxygen Delivery Method: Simple face mask Placement Confirmation: positive ETCO2 Dental Injury: Teeth and Oropharynx as per pre-operative assessment

## 2023-05-19 NOTE — Discharge Instructions (Signed)

## 2023-05-19 NOTE — Interval H&P Note (Signed)
 History and Physical Interval Note:  05/19/2023 8:45 AM  Savannah Martinez  has presented today for surgery, with the diagnosis of FAILED TOTAL KNEE.  The various methods of treatment have been discussed with the patient and family. After consideration of risks, benefits and other options for treatment, the patient has consented to  Procedure(s): TOTAL KNEE REVISION (Left) as a surgical intervention.  The patient's history has been reviewed, patient examined, no change in status, stable for surgery.  I have reviewed the patient's chart and labs.  Questions were answered to the patient's satisfaction.     Donnice JONETTA Car

## 2023-05-20 ENCOUNTER — Encounter (HOSPITAL_COMMUNITY): Payer: Self-pay | Admitting: Orthopedic Surgery

## 2023-05-20 LAB — BASIC METABOLIC PANEL WITH GFR
Anion gap: 8 (ref 5–15)
BUN: 15 mg/dL (ref 8–23)
CO2: 25 mmol/L (ref 22–32)
Calcium: 8.6 mg/dL — ABNORMAL LOW (ref 8.9–10.3)
Chloride: 96 mmol/L — ABNORMAL LOW (ref 98–111)
Creatinine, Ser: 0.53 mg/dL (ref 0.44–1.00)
GFR, Estimated: >60 mL/min (ref >60)
Glucose, Bld: 162 mg/dL — ABNORMAL HIGH (ref 70–99)
Potassium: 4.4 mmol/L (ref 3.5–5.1)
Sodium: 129 mmol/L — ABNORMAL LOW (ref 135–145)

## 2023-05-20 LAB — CBC
HCT: 33.6 % — ABNORMAL LOW (ref 36.0–46.0)
Hemoglobin: 11.2 g/dL — ABNORMAL LOW (ref 12.0–15.0)
MCH: 32 pg (ref 26.0–34.0)
MCHC: 33.3 g/dL (ref 30.0–36.0)
MCV: 96 fL (ref 80.0–100.0)
Platelets: 199 10*3/uL (ref 150–400)
RBC: 3.5 MIL/uL — ABNORMAL LOW (ref 3.87–5.11)
RDW: 12.7 % (ref 11.5–15.5)
WBC: 14.9 10*3/uL — ABNORMAL HIGH (ref 4.0–10.5)
nRBC: 0 % (ref 0.0–0.2)

## 2023-05-20 MED ORDER — POLYETHYLENE GLYCOL 3350 17 G PO PACK: 17.0000 g | PACK | Freq: Two times a day (BID) | ORAL | 0 refills | Status: AC

## 2023-05-20 MED ORDER — TIZANIDINE HCL 2 MG PO TABS: 2.0000 mg | ORAL_TABLET | Freq: Three times a day (TID) | ORAL | 2 refills | Status: AC | PRN

## 2023-05-20 MED ORDER — OXYCODONE HCL 5 MG PO TABS: 5.0000 mg | ORAL_TABLET | ORAL | 0 refills | Status: AC | PRN

## 2023-05-20 MED ORDER — ASPIRIN 81 MG PO CHEW: 81.0000 mg | CHEWABLE_TABLET | Freq: Two times a day (BID) | ORAL | 0 refills | Status: AC

## 2023-05-20 MED ORDER — KETOROLAC TROMETHAMINE 15 MG/ML IJ SOLN
7.5000 mg | Freq: Once | INTRAMUSCULAR | Status: AC
  Administered 2023-05-20: 7.5 mg via INTRAVENOUS
  Filled 2023-05-20: qty 1

## 2023-05-20 MED ORDER — SODIUM CHLORIDE 0.9 % IV BOLUS
500.0000 mL | Freq: Once | INTRAVENOUS | Status: AC
  Administered 2023-05-20: 500 mL via INTRAVENOUS

## 2023-05-20 NOTE — Progress Notes (Signed)
 Physical Therapy Treatment Patient Details Name: Savannah Martinez MRN: [id number] DOB: Sep 03, 1955 Today's Date: 05/20/2023   History of Present Illness Pt 68 yo female s/p L TKA revision 05/19/23.  She has hx including arthritis, chronic pain, R partial knee replacement, HTN, L TKA 05/14/21    PT Comments  Pt is POD # 1 and is progressing well. She was able to ambulate 100'x2 and performed stairs similar to stairs to enter home.  Pt has laundry in basement - advised not to go to basement and have assist for laundry. She did well with PWB, has good pain control, quad activation, ROM, and understanding of HEP.  Pt demonstrates safe gait & transfers in order to return home from PT perspective once discharged by MD.  While in hospital, will continue to benefit from PT for skilled therapy to advance mobility and exercises.       If plan is discharge home, recommend the following: A little help with walking and/or transfers;A little help with bathing/dressing/bathroom;Assistance with cooking/housework;Help with stairs or ramp for entrance   Can travel by private vehicle        Equipment Recommendations  None recommended by PT    Recommendations for Other Services       Precautions / Restrictions Precautions Precautions: Fall;Knee Restrictions Weight Bearing Restrictions Per Provider Order: Yes LLE Weight Bearing Per Provider Order: Partial weight bearing LLE Partial Weight Bearing Percentage or Pounds: 50     Mobility  Bed Mobility Overal bed mobility: Needs Assistance Bed Mobility: Supine to Sit, Sit to Supine     Supine to sit: Supervision Sit to supine: Supervision        Transfers Overall transfer level: Needs assistance Equipment used: Rolling walker (2 wheels) Transfers: Sit to/from Stand Sit to Stand: Supervision           General transfer comment: Performed x 3, did well with PW    Ambulation/Gait Ambulation/Gait assistance: Supervision Gait Distance (Feet): 100  Feet (100'x2) Assistive device: Rolling walker (2 wheels) Gait Pattern/deviations: Step-to pattern, Decreased stride length, Decreased weight shift to left Gait velocity: decreased but functional     General Gait Details: Education/cues for sequencing and step to pattern and pushing down into RW with R LE swing phase in order to maintain PWB with good carryover   Stairs Stairs: Yes Stairs assistance: Contact guard assist Stair Management: Two rails, Step to pattern Number of Stairs: 6 General stair comments: Performed 3 steps  x 2 with education on sequencing and use of rail for PWB.  Pt tolerated well with good maintenance of PWB.  Initial eval pt reports rails on 1 side but was error - that was for steps to basement.  She has bil rails to enter home.   Wheelchair Mobility     Tilt Bed    Modified Rankin (Stroke Patients Only)       Balance Overall balance assessment: Needs assistance Sitting-balance support: No upper extremity supported Sitting balance-Leahy Scale: Good     Standing balance support: Bilateral upper extremity supported, No upper extremity supported Standing balance-Leahy Scale: Fair Standing balance comment: could static stand without UE support (weight shifted toward R side); RW to ambulate ; steady wtih RW                            Cognition Arousal: Alert Behavior During Therapy: WFL for tasks assessed/performed Overall Cognitive Status: Within Functional Limits for tasks assessed  Exercises Total Joint Exercises Ankle Circles/Pumps: AROM, Both, 10 reps, Supine Quad Sets: AROM, Both, 10 reps, Supine Heel Slides: AROM, Left, 10 reps, Supine Hip ABduction/ADduction: AROM, Left, 10 reps, Supine Long Arc Quad: AROM, Left, 10 reps, Seated Knee Flexion: AROM, Left, 10 reps, Seated Goniometric ROM: L knee ROM ~3 to 80 degrees    General Comments  Educated on safe ice use, no  pivots, car transfers, resting with leg straight, and TED hose during day. Also, encouraged walking every 1-2 hours during day. Educated on HEP with focus on mobility the first weeks. Discussed doing exercises within pain control and if pain increasing could decreased ROM, reps, and stop exercises as needed. Encouraged to perform quad sets and ankle pumps frequently for blood flow and to promote full knee extension.  Educated pt on PWB 50%  and transferring, gait, and stairs with PWB.  Pt with good understanding and adherence to [facility]       Pertinent Vitals/Pain Pain Assessment Pain Assessment: 0-10 Pain Score: 4  Pain Location: L knee Pain Descriptors / Indicators: Discomfort Pain Intervention(s): Limited activity within patient's tolerance, Monitored during session, Premedicated before session, Repositioned, Ice applied    Home Living                          Prior Function            PT Goals (current goals can now be found in the care plan section) Progress towards PT goals: Progressing toward goals    Frequency    7X/week      PT Plan      Co-evaluation              AM-PAC PT 6 Clicks Mobility   Outcome Measure  Help needed turning from your back to your side while in a flat bed without using bedrails?: None Help needed moving from lying on your back to sitting on the side of a flat bed without using bedrails?: A Little Help needed moving to and from a bed to a chair (including a wheelchair)?: A Little Help needed standing up from a chair using your arms (e.g., wheelchair or bedside chair)?: A Little Help needed to walk in hospital room?: A Little Help needed climbing 3-5 steps with a railing? : A Little 6 Click Score: 19    End of Session Equipment Utilized During Treatment: Gait belt Activity Tolerance: Patient tolerated treatment well Patient left: with call bell/phone within reach;with chair alarm set;in chair Nurse Communication: Mobility  status (cleared therapy; urinated during session) PT Visit Diagnosis: Other abnormalities of gait and mobility (R26.89);Muscle weakness (generalized) (M62.81)     Time: 8969-8863 (Of note had to step out for 15 mins) PT Time Calculation (min) (ACUTE ONLY): 66 min  Charges:    $Gait Training: 8-22 mins $Therapeutic Exercise: 8-22 mins $Therapeutic Activity: 8-22 mins PT General Charges $$ ACUTE PT VISIT: 1 Visit                     Benjiman, PT Acute Rehab [facility] Rehab [phone]    Benjiman VEAR Mulberry 05/20/2023, 12:53 PM

## 2023-05-20 NOTE — TOC Transition Note (Signed)
 Transition of Care [facility]) - Discharge Note   Patient Details  Name: Savannah Martinez MRN: [id number] Date of Birth: 08/27/1955  Transition of [facility] CM/SW Contact:  NORMAN ASPEN, LCSW Phone Number: 05/20/2023, 10:30 AM   Clinical Narrative:     Met with pt who confirms she has needed DME in the home.  OPPT already arranged with [facility].  No further TOC needs.  Final next level of care: OP Rehab Barriers to Discharge: No Barriers Identified   Patient Goals and CMS Choice Patient states their goals for this hospitalization and ongoing recovery are:: return home          Discharge Placement                       Discharge Plan and Services Additional resources added to the After Visit Summary for                  DME Arranged: N/A DME Agency: NA                  Social Drivers of Health (SDOH) Interventions SDOH Screenings   Food Insecurity: No Food Insecurity (05/19/2023)  Housing: Low Risk  (05/19/2023)  Transportation Needs: No Transportation Needs (05/19/2023)  Utilities: Not At Risk (05/19/2023)  Social Connections: Moderately Integrated (05/19/2023)  Tobacco Use: Low Risk  (05/19/2023)     Readmission Risk Interventions    05/20/2023   10:30 AM  Readmission Risk Prevention Plan  Post Dischage Appt Complete  Medication Screening Complete  Transportation Screening Complete

## 2023-05-20 NOTE — Progress Notes (Signed)
 This RN asked the patient how her pain was. She stated,  I'm hurting. My feet hurt, my legs hurt, and I just don't feel good.  This RN responded, I'm sorry you're hurting so much. I have your Tylenol . How bad would you say your pain is?  Pt stated, I just hurt.  This RN reviewed the pain scale and the patient stated she was 'about a 4'.  She stated, my other nurse gave me Dilaudid  when I couldn't have any other medicine.  This RN stated,  I can't give you IV Dilaudid  if your pain is only a '4'.  The was your doctor wrote the order is IV Dilaudid  for SEVERE PAIN, 7-10. You can get the Oxycodone  again at 0300 if you'd like. But, if your pain gets worse, please call me and we can give the IV med. Is that ok?  She resonded,  No, it's NOT ok.  I don't like you, I don't like the way you talk to me and you've yelled at me all night.  This RN apologized and stated, I not yelling at you. I'm just trying to explain how your Dr ordered the pain meds.  I will be glad to bring you the Oxycodone  at 3 if you'd like.  If the pain worsens before then, let me know and I will be happy to give you the IV Dilaudid .  No response was given by the patient. This RN said, if you need anything before 3, just hit your call light. I then exited her room.

## 2023-05-20 NOTE — Plan of Care (Signed)
   Problem: Education: Goal: Knowledge of General Education information will improve Description Including pain rating scale, medication(s)/side effects and non-pharmacologic comfort measures Outcome: Progressing   Problem: Activity: Goal: Risk for activity intolerance will decrease Outcome: Progressing   Problem: Safety: Goal: Ability to remain free from injury will improve Outcome: Progressing

## 2023-05-20 NOTE — Progress Notes (Signed)
   Subjective: 1 Day Post-Op Procedure(s) (LRB): TOTAL KNEE REVISION (Left) Patient reports pain as moderate.   Patient seen in rounds for Dr. Ernie. Patient is lying on her side in bed resting. She reports pain in her foot, ankle, and knee. She was able to walk 80 feet with PT yesterday. We discussed intra-op findings.  We will continue therapy today.   Objective: Vital signs in last 24 hours: Temp:  [97.1 F (36.2 C)-98 F (36.7 C)] 97.7 F (36.5 C) (01/08 0628) Pulse Rate:  [69-103] 75 (01/08 0628) Resp:  [9-24] 17 (01/08 0628) BP: (93-147)/(57-107) 93/57 (01/08 0628) SpO2:  [93 %-100 %] 98 % (01/08 0628)  Intake/Output from previous day:  Intake/Output Summary (Last 24 hours) at 05/20/2023 0828 Last data filed at 05/20/2023 9371 Gross per 24 hour  Intake 1789.27 ml  Output 2700 ml  Net -910.73 ml     Intake/Output this shift: No intake/output data recorded.  Labs: Recent Labs    05/20/23 0318  HGB 11.2*   Recent Labs    05/20/23 0318  WBC 14.9*  RBC 3.50*  HCT 33.6*  PLT 199   Recent Labs    05/20/23 0318  NA 129*  K 4.4  CL 96*  CO2 25  BUN 15  CREATININE 0.53  GLUCOSE 162*  CALCIUM 8.6*   No results for input(s): LABPT, INR in the last 72 hours.  Exam: General - Patient is Alert and Oriented Extremity - Neurologically intact Sensation intact distally Intact pulses distally Dorsiflexion/Plantar flexion intact Dressing - dressing C/D/I Motor Function - intact, moving foot and toes well on exam.   Past Medical History:  Diagnosis Date   Anxiety    Arthritis    Cancer (HCC)    skin cancers on face   Chronic kidney disease    Chronic pain    Headache    History of kidney stones    Hypertension    Hyperthyroidism    Pneumonia    PONV (postoperative nausea and vomiting)    just with general anesthesia    Assessment/Plan: 1 Day Post-Op Procedure(s) (LRB): TOTAL KNEE REVISION (Left) Principal Problem:   S/P revision of total knee,  left  Estimated body mass index is 26.33 kg/m as calculated from the following:   Height as of this encounter: 5' 3.5 (1.613 m).   Weight as of this encounter: 68.5 kg. Advance diet Up with therapy   DVT Prophylaxis - Aspirin  PWB 50% LLE  Hgb stable at 11.2 this AM. BP soft - will add 500 cc NS bolus today and encouraged PO fluids  Will give a dose of toradol  today and hold off on further celebrex  until d/c   Plan is to go Home after hospital stay. Plan for discharge today following 1-2 sessions of PT as long as they are meeting their goals. Patient is scheduled for OPPT. Follow up in the office in 2 weeks.   Rosina Calin, PA-C Orthopedic Surgery [phone] 05/20/2023, 8:28 AM

## 2023-05-20 NOTE — Plan of Care (Signed)
  Problem: Education: Goal: Knowledge of General Education information will improve Description: Including pain rating scale, medication(s)/side effects and non-pharmacologic comfort measures Outcome: Progressing   Problem: Health Behavior/Discharge Planning: Goal: Ability to manage health-related needs will improve Outcome: Progressing   Problem: Clinical Measurements: Goal: Ability to maintain clinical measurements within normal limits will improve Outcome: Progressing Goal: Will remain free from infection Outcome: Progressing Goal: Diagnostic test results will improve Outcome: Progressing Goal: Respiratory complications will improve Outcome: Progressing Goal: Cardiovascular complication will be avoided Outcome: Progressing   Problem: Activity: Goal: Risk for activity intolerance will decrease Outcome: Adequate for Discharge   Problem: Nutrition: Goal: Adequate nutrition will be maintained Outcome: Completed/Met   Problem: Coping: Goal: Level of anxiety will decrease Outcome: Progressing   Problem: Elimination: Goal: Will not experience complications related to bowel motility Outcome: Progressing Goal: Will not experience complications related to urinary retention Outcome: Progressing   Problem: Pain Management: Goal: General experience of comfort will improve Outcome: Progressing   Problem: Safety: Goal: Ability to remain free from injury will improve Outcome: Progressing   Problem: Skin Integrity: Goal: Risk for impaired skin integrity will decrease Outcome: Adequate for Discharge   Problem: Education: Goal: Knowledge of General Education information will improve Description: Including pain rating scale, medication(s)/side effects and non-pharmacologic comfort measures Outcome: Adequate for Discharge   Problem: Health Behavior/Discharge Planning: Goal: Ability to manage health-related needs will improve Outcome: Progressing   Problem: Clinical  Measurements: Goal: Ability to maintain clinical measurements within normal limits will improve Outcome: Progressing Goal: Will remain free from infection Outcome: Progressing Goal: Diagnostic test results will improve Outcome: Progressing Goal: Respiratory complications will improve Outcome: Progressing Goal: Cardiovascular complication will be avoided Outcome: Progressing   Problem: Activity: Goal: Risk for activity intolerance will decrease Outcome: Progressing   Problem: Nutrition: Goal: Adequate nutrition will be maintained Outcome: Completed/Met   Problem: Coping: Goal: Level of anxiety will decrease Outcome: Progressing   Problem: Elimination: Goal: Will not experience complications related to bowel motility Outcome: Progressing Goal: Will not experience complications related to urinary retention Outcome: Progressing   Problem: Pain Management: Goal: General experience of comfort will improve Outcome: Progressing   Problem: Safety: Goal: Ability to remain free from injury will improve Outcome: Progressing   Problem: Skin Integrity: Goal: Risk for impaired skin integrity will decrease Outcome: Adequate for Discharge   Problem: Education: Goal: Knowledge of the prescribed therapeutic regimen will improve Outcome: Adequate for Discharge Goal: Individualized Educational Video(s) Outcome: Completed/Met   Problem: Activity: Goal: Ability to avoid complications of mobility impairment will improve Outcome: Progressing Goal: Range of joint motion will improve Outcome: Adequate for Discharge   Problem: Clinical Measurements: Goal: Postoperative complications will be avoided or minimized Outcome: Progressing   Problem: Pain Management: Goal: Pain level will decrease with appropriate interventions Outcome: Progressing   Problem: Education: Goal: Knowledge of the prescribed therapeutic regimen will improve Outcome: Adequate for Discharge Goal: Individualized  Educational Video(s) Outcome: Completed/Met   Problem: Clinical Measurements: Goal: Postoperative complications will be avoided or minimized Outcome: Progressing   Problem: Pain Management: Goal: Pain level will decrease with appropriate interventions Outcome: Adequate for Discharge   Problem: Skin Integrity: Goal: Will show signs of wound healing Outcome: Progressing

## 2023-05-31 NOTE — Discharge Summary (Signed)
Patient ID: Savannah Martinez MRN: [id number] DOB/AGE: 1956-01-14 68 y.o.  Admit date: 05/19/2023 Discharge date: 05/20/2023  Admission Diagnoses:  Failed left total knee arthroplasty   Discharge Diagnoses:  Principal Problem:   S/P revision of total knee, left   Past Medical History:  Diagnosis Date   Anxiety    Arthritis    Cancer (HCC)    skin cancers on face   Chronic kidney disease    Chronic pain    Headache    History of kidney stones    Hypertension    Hyperthyroidism    Pneumonia    PONV (postoperative nausea and vomiting)    just with general anesthesia    Surgeries: Procedure(s): TOTAL KNEE REVISION on 05/19/2023   Consultants:   Discharged Condition: Improved  Hospital Course: Savannah Martinez is an 68 y.o. female who was admitted 05/19/2023 for operative treatment ofS/P revision of total knee, left. Patient has severe unremitting pain that affects sleep, daily activities, and work/hobbies. After pre-op clearance the patient was taken to the operating room on 05/19/2023 and underwent  Procedure(s): TOTAL KNEE REVISION.    Patient was given perioperative antibiotics:  Anti-infectives (From admission, onward)    Start     Dose/Rate Route Frequency Ordered Stop   05/19/23 1600  ceFAZolin (ANCEF) IVPB 2g/100 mL premix        2 g 200 mL/hr over 30 Minutes Intravenous Every 6 hours 05/19/23 1547 05/19/23 2329   05/19/23 0845  vancomycin (VANCOCIN) IVPB 1000 mg/200 mL premix        1,000 mg 200 mL/hr over 60 Minutes Intravenous To Surgery 05/19/23 0743 05/19/23 0905   05/19/23 0745  ceFAZolin (ANCEF) IVPB 2g/100 mL premix        2 g 200 mL/hr over 30 Minutes Intravenous On call to O.R. 05/19/23 0743 05/19/23 1040        Patient was given sequential compression devices, early ambulation, and chemoprophylaxis to prevent DVT. Patient worked with PT and was meeting their goals regarding safe ambulation and transfers.  Patient benefited maximally from hospital stay and  there were no complications.    Recent vital signs: No data found.   Recent laboratory studies: No results for input(s): "WBC", "HGB", "HCT", "PLT", "NA", "K", "CL", "CO2", "BUN", "CREATININE", "GLUCOSE", "INR", "CALCIUM" in the last 72 hours.  Invalid input(s): "PT", "2"   Discharge Medications:   Allergies as of 05/20/2023       Reactions   Nickel    Skin irritation    Pregabalin    Increased appetite   Trazodone    It made her feel "weird".        Medication List     STOP taking these medications    ibuprofen 800 MG tablet Commonly known as: ADVIL       TAKE these medications    amphetamine-dextroamphetamine 30 MG tablet Commonly known as: ADDERALL Take 30 mg by mouth daily.   aspirin 81 MG chewable tablet Chew 1 tablet (81 mg total) by mouth 2 (two) times daily for 28 days.   CALCIUM PO Take 1 tablet by mouth every 3 (three) days.   chlorhexidine 4 % external liquid Commonly known as: HIBICLENS Apply 15 mLs (1 Application total) topically as directed for 30 doses. Use as directed daily for 5 days every other week for 6 weeks.   DULoxetine 30 MG capsule Commonly known as: CYMBALTA Take 30 mg by mouth daily.   ezetimibe 10 MG tablet Commonly known as:  ZETIA Take 10 mg by mouth every other day.   levothyroxine 50 MCG tablet Commonly known as: SYNTHROID Take 50 mcg by mouth daily before breakfast.   liothyronine 5 MCG tablet Commonly known as: CYTOMEL Take 5 mcg by mouth daily.   LORazepam 0.5 MG tablet Commonly known as: ATIVAN Take 0.5 mg by mouth at bedtime as needed for sleep.   MAGNESIUM CITRATE PO Take 1-2 tablets by mouth daily.   METAMUCIL PO Take 1 Scoop by mouth daily.   multivitamin with minerals Tabs tablet Take 1 tablet by mouth once a week.   mupirocin ointment 2 % Commonly known as: BACTROBAN Place 1 Application into the nose 2 (two) times daily for 60 doses. Use as directed 2 times daily for 5 days every other week for 6  weeks.   oxyCODONE 5 MG immediate release tablet Commonly known as: Oxy IR/ROXICODONE Take 1-2 tablets (5-10 mg total) by mouth every 4 (four) hours as needed for severe pain (pain score 7-10).   polyethylene glycol 17 g packet Commonly known as: MIRALAX / GLYCOLAX Take 17 g by mouth 2 (two) times daily.   rizatriptan 10 MG disintegrating tablet Commonly known as: MAXALT-MLT Take 10 mg by mouth 2 (two) times daily as needed for migraine.   rosuvastatin 5 MG tablet Commonly known as: CRESTOR Take 5 mg by mouth daily.   SYSTANE OP Place 1 drop into both eyes daily as needed (dry eyes).   tiZANidine 2 MG tablet Commonly known as: ZANAFLEX Take 1-2 tablets (2-4 mg total) by mouth every 8 (eight) hours as needed for muscle spasms (muscle pain).   Vitamin D3 Ultra Strength 125 MCG (5000 UT) capsule Generic drug: Cholecalciferol Take 5,000 Units by mouth daily.   Xiidra 5 % Soln Generic drug: Lifitegrast Place 1 drop into both eyes in the morning and at bedtime.               Discharge Care Instructions  (From admission, onward)           Start     Ordered   05/20/23 0000  Change dressing       Comments: Maintain surgical dressing until follow up in the clinic. If the edges start to pull up, may reinforce with tape. If the dressing is no longer working, may remove and cover with gauze and tape, but must keep the area dry and clean.  Call with any questions or concerns.   05/20/23 1407            Diagnostic Studies: No results found.  Disposition: Discharge disposition: 01-Home or Self Care       Discharge Instructions     Call MD / Call 911   Complete by: As directed    If you experience chest pain or shortness of breath, CALL 911 and be transported to the hospital emergency room.  If you develope a fever above 101 F, pus (white drainage) or increased drainage or redness at the wound, or calf pain, call your surgeon's office.   Change dressing    Complete by: As directed    Maintain surgical dressing until follow up in the clinic. If the edges start to pull up, may reinforce with tape. If the dressing is no longer working, may remove and cover with gauze and tape, but must keep the area dry and clean.  Call with any questions or concerns.   Constipation Prevention   Complete by: As directed    Drink plenty of  fluids.  Prune juice may be helpful.  You may use a stool softener, such as Colace (over the counter) 100 mg twice a day.  Use MiraLax (over the counter) for constipation as needed.   Diet - low sodium heart healthy   Complete by: As directed    Increase activity slowly as tolerated   Complete by: As directed    Weight bearing as tolerated with assist device (walker, cane, etc) as directed, use it as long as suggested by your surgeon or therapist, typically at least 4-6 weeks.   Post-operative opioid taper instructions:   Complete by: As directed    POST-OPERATIVE OPIOID TAPER INSTRUCTIONS: It is important to wean off of your opioid medication as soon as possible. If you do not need pain medication after your surgery it is ok to stop day one. Opioids include: Codeine, Hydrocodone(Norco, Vicodin), Oxycodone(Percocet, oxycontin) and hydromorphone amongst others.  Long term and even short term use of opiods can cause: Increased pain response Dependence Constipation Depression Respiratory depression And more.  Withdrawal symptoms can include Flu like symptoms Nausea, vomiting And more Techniques to manage these symptoms Hydrate well Eat regular healthy meals Stay active Use relaxation techniques(deep breathing, meditating, yoga) Do Not substitute Alcohol to help with tapering If you have been on opioids for less than two weeks and do not have pain than it is ok to stop all together.  Plan to wean off of opioids This plan should start within one week post op of your joint replacement. Maintain the same interval or time  between taking each dose and first decrease the dose.  Cut the total daily intake of opioids by one tablet each day Next start to increase the time between doses. The last dose that should be eliminated is the evening dose.      TED hose   Complete by: As directed    Use stockings (TED hose) for 2 weeks on both leg(s).  You may remove them at night for sleeping.        Follow-up Information     Durene Romans, MD. Schedule an appointment as soon as possible for a visit in 2 week(s).   Specialty: Orthopedic Surgery Contact information: [address] [place] [address] [phone]                  Signed: Cassandria Anger 05/31/2023, 7:32 AM

## 2024-01-05 LAB — COLOGUARD: COLOGUARD: NEGATIVE

## 2024-01-19 ENCOUNTER — Encounter: Payer: Self-pay | Admitting: Orthopedic Surgery

## 2024-01-19 ENCOUNTER — Other Ambulatory Visit (HOSPITAL_COMMUNITY): Payer: Self-pay | Admitting: Orthopedic Surgery

## 2024-01-19 DIAGNOSIS — Z96651 Presence of right artificial knee joint: Secondary | ICD-10-CM

## 2024-01-22 ENCOUNTER — Encounter (HOSPITAL_COMMUNITY)

## 2024-01-22 ENCOUNTER — Encounter (HOSPITAL_COMMUNITY): Payer: Self-pay

## 2024-01-22 ENCOUNTER — Other Ambulatory Visit (HOSPITAL_COMMUNITY)

## 2024-05-31 ENCOUNTER — Other Ambulatory Visit (HOSPITAL_COMMUNITY): Payer: Self-pay
